# Patient Record
Sex: Male | Born: 1935 | Race: White | Hispanic: No | Smoking: Never smoker
Health system: Southern US, Community
[De-identification: ages and names within clinical notes are randomized; demographics above are authoritative.]

## PROBLEM LIST (undated history)

## (undated) DIAGNOSIS — K219 Gastro-esophageal reflux disease without esophagitis: Secondary | ICD-10-CM

## (undated) DIAGNOSIS — Z950 Presence of cardiac pacemaker: Secondary | ICD-10-CM

## (undated) DIAGNOSIS — I2510 Atherosclerotic heart disease of native coronary artery without angina pectoris: Secondary | ICD-10-CM

## (undated) DIAGNOSIS — I4891 Unspecified atrial fibrillation: Secondary | ICD-10-CM

## (undated) DIAGNOSIS — N189 Chronic kidney disease, unspecified: Secondary | ICD-10-CM

## (undated) DIAGNOSIS — Z95810 Presence of automatic (implantable) cardiac defibrillator: Secondary | ICD-10-CM

## (undated) DIAGNOSIS — I509 Heart failure, unspecified: Secondary | ICD-10-CM

## (undated) HISTORY — PX: HERNIA REPAIR: SHX51

## (undated) HISTORY — PX: CATARACT EXTRACTION: SUR2

## (undated) HISTORY — PX: CORONARY ARTERY BYPASS GRAFT: SHX141

---

## 2004-04-03 ENCOUNTER — Ambulatory Visit: Payer: Self-pay | Admitting: Rheumatology

## 2004-05-11 ENCOUNTER — Ambulatory Visit: Payer: Self-pay | Admitting: Psychiatry

## 2004-07-06 ENCOUNTER — Ambulatory Visit: Payer: Self-pay | Admitting: Psychiatry

## 2004-08-17 ENCOUNTER — Ambulatory Visit: Payer: Self-pay | Admitting: Psychiatry

## 2005-01-20 ENCOUNTER — Ambulatory Visit: Payer: Self-pay | Admitting: Family Medicine

## 2006-04-29 ENCOUNTER — Other Ambulatory Visit: Payer: Self-pay

## 2006-04-29 ENCOUNTER — Inpatient Hospital Stay: Payer: Self-pay | Admitting: Internal Medicine

## 2006-10-24 ENCOUNTER — Emergency Department: Payer: Self-pay

## 2009-07-19 ENCOUNTER — Inpatient Hospital Stay: Payer: Self-pay | Admitting: Cardiology

## 2013-04-27 ENCOUNTER — Telehealth (HOSPITAL_COMMUNITY): Payer: Self-pay

## 2013-07-16 ENCOUNTER — Ambulatory Visit: Payer: Self-pay | Admitting: Cardiology

## 2013-07-16 DIAGNOSIS — I10 Essential (primary) hypertension: Secondary | ICD-10-CM

## 2013-07-16 LAB — BASIC METABOLIC PANEL
Anion Gap: 10 (ref 7–16)
BUN: 27 mg/dL — ABNORMAL HIGH (ref 7–18)
Calcium, Total: 9.1 mg/dL (ref 8.5–10.1)
Chloride: 104 mmol/L (ref 98–107)
Co2: 24 mmol/L (ref 21–32)
Creatinine: 1.23 mg/dL (ref 0.60–1.30)
EGFR (African American): 59 — ABNORMAL LOW
GFR CALC NON AF AMER: 51 — AB
Glucose: 88 mg/dL (ref 65–99)
Osmolality: 280 (ref 275–301)
Potassium: 4.3 mmol/L (ref 3.5–5.1)
SODIUM: 138 mmol/L (ref 136–145)

## 2013-07-16 LAB — URINALYSIS, COMPLETE
Bilirubin,UR: NEGATIVE
GLUCOSE, UR: NEGATIVE mg/dL (ref 0–75)
Ketone: NEGATIVE
LEUKOCYTE ESTERASE: NEGATIVE
NITRITE: POSITIVE
PH: 6 (ref 4.5–8.0)
Protein: NEGATIVE
SPECIFIC GRAVITY: 1.013 (ref 1.003–1.030)
SQUAMOUS EPITHELIAL: NONE SEEN
WBC UR: 3 /HPF (ref 0–5)

## 2013-07-16 LAB — CBC WITH DIFFERENTIAL/PLATELET
BANDS NEUTROPHIL: 1 %
Comment - H1-Com2: NORMAL
Eosinophil: 3 %
HCT: 43.4 % (ref 40.0–52.0)
HGB: 14.6 g/dL (ref 13.0–18.0)
LYMPHS PCT: 27 %
MCH: 32 pg (ref 26.0–34.0)
MCHC: 33.6 g/dL (ref 32.0–36.0)
MCV: 95 fL (ref 80–100)
MONOS PCT: 12 %
PLATELETS: 151 10*3/uL (ref 150–440)
RBC: 4.55 10*6/uL (ref 4.40–5.90)
RDW: 13.1 % (ref 11.5–14.5)
Segmented Neutrophils: 56 %
Variant Lymphocyte - H1-Rlymph: 1 %
WBC: 10.3 10*3/uL (ref 3.8–10.6)

## 2013-07-16 LAB — PROTIME-INR
INR: 1.4
Prothrombin Time: 17.3 secs — ABNORMAL HIGH (ref 11.5–14.7)

## 2013-07-16 LAB — APTT: Activated PTT: 25.8 secs (ref 23.6–35.9)

## 2013-07-24 ENCOUNTER — Ambulatory Visit: Payer: Self-pay | Admitting: Cardiology

## 2014-07-10 NOTE — Op Note (Signed)
PATIENT NAME:  Vincent [name], Vincent [name] MR#:  [id number] DATE OF BIRTH:  [birth year]  DATE OF PROCEDURE:  07/24/2013  ATTENDING:  Caryn BeeKevin L. Maisie Fushomas, M.[name].  TITLE OF PROCEDURE NOTE: Implantation of a new biventricular implantable cardiac defibrillator generator.   INDICATION: Biventricular ICD generator at elective replacement indicator.   DETAILS OF PROCEDURE: The patient was brought to the operating room in a fasting, nonsedated state. The appropriate resuscitative equipment was attached to the patient.   The patient was prepped and draped in the usual sterile manner. The area of the right infraclavicular fossa was scrubbed with chlorhexidine. Using a scalpel, a 3 cm incision was made over the old incision site. Using a combination of electrocautery and blunt dissection, the bi-V ICD was removed from the pocket and disconnected from the wires. At this juncture, adequate hemostasis was obtained. The leads were interrogated and showed the following: P waves were 0.9 mV. The patient was in atrial fibrillation. Pacing impedance was 437. The RV lead R waves were 9.8 mV, threshold of 1.5 v at 0.4 ms and the LV lead showed a threshold of 1.5 v at 0.6 ms, and then lead impedance of 627.   The pocket was then irrigated with copious amounts of gentamicin solution. Again, hemostasis was confirmed. The new generator was attached to the leads and placed in the pocket and the pocket was closed with running layers of 2-0 and 3-0 Vicryl, and the subcuticular layer with 4-0 Vicryl. Steri-Strips were attached over the incision site, as well as a sterile gauze and OpSite.   SUMMARY OF IMPLANTED HARDWARE: The patient received a Medtronic biventricular ICD, model Viva XT CRT-[name], model number DTBA1D1, serial number [id number], implanted 07/24/2013. The right atrial lead is a Medtronic [id number], serial number [id number], implanted 08/04/2009. The RV lead is a Medtronic [id number], serial number [id number], implanted 08/04/2009. The LV lead is a  Medtronic [id number], serial number [id number], implanted 08/04/2009.   FINAL PROGRAM PARAMETERS: The device was program mode DDD with a lower rate limit of 75, upper tracking rate 120, upper sensory rate 120, adaptive CRT was programmed on, paced AV delay 130, sensed 100 with LV/RV offset of 0. Outputs were LV 2.25 at 0.6, RV 2.5 at 0.4 and atrial 3.5 at 0.4. VF detection: The device was programmed as a primary prevention device with VF detection at rates of greater than 200 beats per minute with an initial detection of 30 out of 40 intervals. VT monitor set at 167 with 32 intervals.   There were no complications noted at the conclusion of the procedure.   ESTIMATED BLOOD LOSS:   Minimal.     ____________________________ Anna GenreKevin L. Maisie Fushomas, MD klt:dmm [name]: 07/24/2013 15:54:44 ET T: 07/24/2013 21:10:45 ET JOB#: [id number]  cc: Caryn BeeKevin L. Maisie Fushomas, MD, <Dictator> Sharion SettlerKEVIN L Samir MD ELECTRONICALLY SIGNED 08/20/2013 16:50

## 2015-02-01 ENCOUNTER — Other Ambulatory Visit: Payer: Self-pay | Admitting: Internal Medicine

## 2015-02-01 ENCOUNTER — Ambulatory Visit
Admission: RE | Admit: 2015-02-01 | Discharge: 2015-02-01 | Disposition: A | Discharge status: Home / Self Care | Payer: Medicare PPO | Source: Ambulatory Visit | Attending: Internal Medicine | Admitting: Internal Medicine

## 2015-02-01 DIAGNOSIS — N261 Atrophy of kidney (terminal): Secondary | ICD-10-CM | POA: Diagnosis not present

## 2015-02-01 DIAGNOSIS — N281 Cyst of kidney, acquired: Secondary | ICD-10-CM | POA: Diagnosis not present

## 2015-02-01 DIAGNOSIS — R1084 Generalized abdominal pain: Secondary | ICD-10-CM | POA: Diagnosis present

## 2015-02-01 DIAGNOSIS — I714 Abdominal aortic aneurysm, without rupture: Secondary | ICD-10-CM | POA: Diagnosis not present

## 2015-06-02 ENCOUNTER — Observation Stay
Admission: EM | Admit: 2015-06-02 | Discharge: 2015-06-03 | Disposition: A | Discharge status: Home / Self Care | Payer: Medicare PPO | Attending: Internal Medicine | Admitting: Internal Medicine

## 2015-06-02 ENCOUNTER — Emergency Department: Payer: Medicare PPO

## 2015-06-02 DIAGNOSIS — I482 Chronic atrial fibrillation: Secondary | ICD-10-CM | POA: Diagnosis not present

## 2015-06-02 DIAGNOSIS — Z6833 Body mass index (BMI) 33.0-33.9, adult: Secondary | ICD-10-CM | POA: Diagnosis not present

## 2015-06-02 DIAGNOSIS — I429 Cardiomyopathy, unspecified: Secondary | ICD-10-CM | POA: Diagnosis not present

## 2015-06-02 DIAGNOSIS — T45515A Adverse effect of anticoagulants, initial encounter: Secondary | ICD-10-CM | POA: Insufficient documentation

## 2015-06-02 DIAGNOSIS — I4892 Unspecified atrial flutter: Secondary | ICD-10-CM | POA: Insufficient documentation

## 2015-06-02 DIAGNOSIS — E669 Obesity, unspecified: Secondary | ICD-10-CM | POA: Diagnosis not present

## 2015-06-02 DIAGNOSIS — R079 Chest pain, unspecified: Principal | ICD-10-CM | POA: Diagnosis present

## 2015-06-02 DIAGNOSIS — Z7901 Long term (current) use of anticoagulants: Secondary | ICD-10-CM | POA: Diagnosis not present

## 2015-06-02 DIAGNOSIS — M7989 Other specified soft tissue disorders: Secondary | ICD-10-CM | POA: Insufficient documentation

## 2015-06-02 DIAGNOSIS — E785 Hyperlipidemia, unspecified: Secondary | ICD-10-CM | POA: Diagnosis not present

## 2015-06-02 DIAGNOSIS — Z951 Presence of aortocoronary bypass graft: Secondary | ICD-10-CM | POA: Diagnosis not present

## 2015-06-02 DIAGNOSIS — I2510 Atherosclerotic heart disease of native coronary artery without angina pectoris: Secondary | ICD-10-CM | POA: Insufficient documentation

## 2015-06-02 DIAGNOSIS — M1990 Unspecified osteoarthritis, unspecified site: Secondary | ICD-10-CM | POA: Diagnosis not present

## 2015-06-02 DIAGNOSIS — R918 Other nonspecific abnormal finding of lung field: Secondary | ICD-10-CM | POA: Insufficient documentation

## 2015-06-02 DIAGNOSIS — G629 Polyneuropathy, unspecified: Secondary | ICD-10-CM | POA: Diagnosis not present

## 2015-06-02 DIAGNOSIS — I130 Hypertensive heart and chronic kidney disease with heart failure and stage 1 through stage 4 chronic kidney disease, or unspecified chronic kidney disease: Secondary | ICD-10-CM | POA: Diagnosis not present

## 2015-06-02 DIAGNOSIS — I5022 Chronic systolic (congestive) heart failure: Secondary | ICD-10-CM | POA: Diagnosis not present

## 2015-06-02 DIAGNOSIS — K219 Gastro-esophageal reflux disease without esophagitis: Secondary | ICD-10-CM | POA: Insufficient documentation

## 2015-06-02 DIAGNOSIS — I5189 Other ill-defined heart diseases: Secondary | ICD-10-CM | POA: Diagnosis not present

## 2015-06-02 DIAGNOSIS — M79661 Pain in right lower leg: Secondary | ICD-10-CM | POA: Insufficient documentation

## 2015-06-02 DIAGNOSIS — Z79899 Other long term (current) drug therapy: Secondary | ICD-10-CM | POA: Diagnosis not present

## 2015-06-02 DIAGNOSIS — Z95810 Presence of automatic (implantable) cardiac defibrillator: Secondary | ICD-10-CM | POA: Insufficient documentation

## 2015-06-02 DIAGNOSIS — M79671 Pain in right foot: Secondary | ICD-10-CM | POA: Diagnosis not present

## 2015-06-02 DIAGNOSIS — N189 Chronic kidney disease, unspecified: Secondary | ICD-10-CM | POA: Insufficient documentation

## 2015-06-02 HISTORY — DX: Heart failure, unspecified: I50.9

## 2015-06-02 HISTORY — DX: Presence of automatic (implantable) cardiac defibrillator: Z95.810

## 2015-06-02 HISTORY — DX: Presence of cardiac pacemaker: Z95.0

## 2015-06-02 HISTORY — DX: Atherosclerotic heart disease of native coronary artery without angina pectoris: I25.10

## 2015-06-02 NOTE — ED Notes (Signed)
Pt presents to ED with c/o sharp left sided chest pain with sob. Pt states his pain woke him up from his sleep tonight about an hour ago. Denies pain currently and states his pain only lasted a few minutes. Pt states his is no longer sob. Pt alert and answering questions without difficulty. No increased work of breathing or distress noted at this time. Skin warm and dry.

## 2015-06-03 ENCOUNTER — Emergency Department: Payer: Medicare PPO

## 2015-06-03 DIAGNOSIS — R079 Chest pain, unspecified: Secondary | ICD-10-CM | POA: Diagnosis present

## 2015-06-03 LAB — BASIC METABOLIC PANEL
ANION GAP: 5 (ref 5–15)
BUN: 37 mg/dL — ABNORMAL HIGH (ref 6–20)
CALCIUM: 8.7 mg/dL — AB (ref 8.9–10.3)
CO2: 26 mmol/L (ref 22–32)
Chloride: 107 mmol/L (ref 101–111)
Creatinine, Ser: 1.65 mg/dL — ABNORMAL HIGH (ref 0.61–1.24)
GFR calc non Af Amer: 34 mL/min — ABNORMAL LOW (ref >60)
GFR, EST AFRICAN AMERICAN: 40 mL/min — AB (ref >60)
GLUCOSE: 109 mg/dL — AB (ref 65–99)
Potassium: 3.4 mmol/L — ABNORMAL LOW (ref 3.5–5.1)
Sodium: 138 mmol/L (ref 135–145)

## 2015-06-03 LAB — TROPONIN I: Troponin I: <0.03 ng/mL (ref <0.031)

## 2015-06-03 LAB — CBC
HCT: 40.6 % (ref 40.0–52.0)
HEMATOCRIT: 40.9 % (ref 40.0–52.0)
HEMOGLOBIN: 13.6 g/dL (ref 13.0–18.0)
HEMOGLOBIN: 13.8 g/dL (ref 13.0–18.0)
MCH: 30.9 pg (ref 26.0–34.0)
MCH: 31.3 pg (ref 26.0–34.0)
MCHC: 33.3 g/dL (ref 32.0–36.0)
MCHC: 34.1 g/dL (ref 32.0–36.0)
MCV: 91.9 fL (ref 80.0–100.0)
MCV: 93 fL (ref 80.0–100.0)
Platelets: 135 10*3/uL — ABNORMAL LOW (ref 150–440)
Platelets: 136 10*3/uL — ABNORMAL LOW (ref 150–440)
RBC: 4.4 MIL/uL (ref 4.40–5.90)
RBC: 4.42 MIL/uL (ref 4.40–5.90)
RDW: 13.6 % (ref 11.5–14.5)
RDW: 13.7 % (ref 11.5–14.5)
WBC: 6.1 10*3/uL (ref 3.8–10.6)
WBC: 6.4 10*3/uL (ref 3.8–10.6)

## 2015-06-03 LAB — CREATININE, SERUM
CREATININE: 1.35 mg/dL — AB (ref 0.61–1.24)
GFR calc Af Amer: 50 mL/min — ABNORMAL LOW (ref >60)
GFR calc non Af Amer: 44 mL/min — ABNORMAL LOW (ref >60)

## 2015-06-03 LAB — PROTIME-INR
INR: 4.27
Prothrombin Time: 39.9 seconds — ABNORMAL HIGH (ref 11.4–15.0)

## 2015-06-03 MED ORDER — ISOSORBIDE MONONITRATE ER 30 MG PO TB24: 30.0000 mg | ORAL_TABLET | Freq: Every day | ORAL | Status: DC

## 2015-06-03 MED ORDER — ACETAMINOPHEN 325 MG PO TABS: 650.0000 mg | ORAL_TABLET | ORAL | Status: DC | PRN

## 2015-06-03 MED ORDER — METOPROLOL SUCCINATE ER 25 MG PO TB24
25.0000 mg | ORAL_TABLET | Freq: Every day | ORAL | Status: DC
  Administered 2015-06-03: 25 mg via ORAL
  Filled 2015-06-03: qty 1

## 2015-06-03 MED ORDER — FUROSEMIDE 40 MG PO TABS
40.0000 mg | ORAL_TABLET | Freq: Two times a day (BID) | ORAL | Status: DC
  Administered 2015-06-03: 40 mg via ORAL
  Filled 2015-06-03: qty 1

## 2015-06-03 MED ORDER — METOLAZONE 2.5 MG PO TABS
2.5000 mg | ORAL_TABLET | Freq: Every day | ORAL | Status: DC
  Administered 2015-06-03: 2.5 mg via ORAL
  Filled 2015-06-03: qty 1

## 2015-06-03 MED ORDER — ONDANSETRON HCL 4 MG/2ML IJ SOLN: 4.0000 mg | Freq: Four times a day (QID) | INTRAMUSCULAR | Status: DC | PRN

## 2015-06-03 MED ORDER — SIMVASTATIN 40 MG PO TABS
40.0000 mg | ORAL_TABLET | Freq: Every day | ORAL | Status: DC
  Filled 2015-06-03: qty 1

## 2015-06-03 MED ORDER — ENOXAPARIN SODIUM 40 MG/0.4ML ~~LOC~~ SOLN: 40.0000 mg | SUBCUTANEOUS | Status: DC

## 2015-06-03 MED ORDER — MORPHINE SULFATE (PF) 2 MG/ML IV SOLN: 2.0000 mg | INTRAVENOUS | Status: DC | PRN

## 2015-06-03 MED ORDER — POTASSIUM CHLORIDE CRYS ER 20 MEQ PO TBCR
20.0000 meq | EXTENDED_RELEASE_TABLET | Freq: Every day | ORAL | Status: DC
  Administered 2015-06-03: 20 meq via ORAL
  Filled 2015-06-03: qty 1

## 2015-06-03 NOTE — H&P (Signed)
[facility] - [place] at [facility]   PATIENT NAME: Vincent [name]    MR#:  [id number]  DATE OF BIRTH:  01/25/1920  DATE OF ADMISSION:  06/02/2015  PRIMARY CARE PHYSICIAN: Danella Penton, MD   REQUESTING/REFERRING PHYSICIAN: Dr. Manson Passey  CHIEF COMPLAINT:   Chest pain tonight HISTORY OF PRESENT ILLNESS:  Vincent [name]  is a 82 y.o. male with a known history of chronic systolic congestive heart failure, coronary artery disease status post CABG, chronic atrial fibrillation on Coumadin, diverticulosis, GERD, peripheral neuropathy comes to the emergency room with complaints of chest pain. Patient said he woke up in the middle of the night complaining of chest pain in the left mid substernal area. He felt a twinge intermittently as couple times. Denies any radiation of pain diaphoresis nausea or vomiting. Came to the emergency room found to be in paced sinus rhythm. First set of cardiac enzyme is negative. Patient also has bilateral lower extremity edema which is chronic, where he thinks his swelling has increased and has some pain in his right foot. He is being admitted for further evaluation and management.  PAST MEDICAL HISTORY:  Chronic atrial fibrillation. Next cataract next congestive heart failure systolic next diverticulosis, peripheral neuropathy, chronic renal insufficiency creatinine 1.5 a sign next fascial plantar fibromatosis.  PAST SURGICAL HISTOIRY:  Hernia repair, history of eye surgery, cataract extraction, nephrolithiasis, CABG in 2004.  SOCIAL HISTORY:   Social History  Substance Use Topics  . Smoking status: Not on file  . Smokeless tobacco: Not on file  . Alcohol Use: Not on file    FAMILY HISTORY:  Hypertension  DRUG ALLERGIES:  No Known Allergies  REVIEW OF SYSTEMS:  Review of Systems  Constitutional: Negative for fever, chills and weight loss.  HENT: Negative for ear discharge, ear pain and nosebleeds.   Eyes: Negative for blurred  vision, pain and discharge.  Respiratory: Negative for sputum production, shortness of breath, wheezing and stridor.   Cardiovascular: Positive for chest pain. Negative for palpitations, orthopnea and PND.  Gastrointestinal: Negative for nausea, vomiting, abdominal pain and diarrhea.  Genitourinary: Negative for urgency and frequency.  Musculoskeletal: Negative for back pain and joint pain.  Neurological: Negative for sensory change, speech change, focal weakness and weakness.  Psychiatric/Behavioral: Negative for depression and hallucinations. The patient is not nervous/anxious.   All other systems reviewed and are negative.    MEDICATIONS AT HOME:   Prior to Admission medications   Medication Sig Start Date End Date Taking? Authorizing Provider  furosemide (LASIX) 40 MG tablet Take 1 tablet by mouth 2 (two) times daily. 04/16/15  Yes Historical Provider, MD  KLOR-CON M20 20 MEQ tablet Take 1 tablet by mouth daily. 05/10/15  Yes Historical Provider, MD  metolazone (ZAROXOLYN) 2.5 MG tablet  03/08/15  Yes Historical Provider, MD  metoprolol succinate (TOPROL-XL) 25 MG 24 hr tablet Take 1 tablet by mouth daily. 05/13/15  Yes Historical Provider, MD  simvastatin (ZOCOR) 40 MG tablet Take 1 tablet by mouth daily. 05/10/15  Yes Historical Provider, MD  warfarin (COUMADIN) 4 MG tablet Take 1 tablet by mouth daily. 05/16/15  Yes Historical Provider, MD      VITAL SIGNS:  Blood pressure 155/86, pulse 75, temperature 97.6 F (36.4 C), temperature source Oral, resp. rate 19, height  (1.702 m), weight 97.523 kg (215 lb), SpO2 98 %.  PHYSICAL EXAMINATION:  GENERAL:  82 y.o.-year-old patient lying in the bed with no acute distress.  EYES: Pupils equal, round,  reactive to light and accommodation. No scleral icterus. Extraocular muscles intact.  HEENT: Head atraumatic, normocephalic. Oropharynx and nasopharynx clear.  NECK:  Supple, no jugular venous distention. No thyroid enlargement, no  tenderness.  LUNGS: Normal breath sounds bilaterally, no wheezing, rales,rhonchi or crepitation. No use of accessory muscles of respiration.  CARDIOVASCULAR: S1, S2 normal. No murmurs, rubs, or gallops.  ABDOMEN: Soft, nontender, nondistended. Bowel sounds present. No organomegaly or mass.  EXTREMITIES:2+l edema, cyanosis, or clubbing. Dry skin onychomycosis  NEUROLOGIC: Cranial nerves II through XII are intact. Muscle strength 5/5 in all extremities. Sensation intact. Gait not checked.  PSYCHIATRIC: The patient is alert and oriented x 3.  SKIN: No obvious rash, lesion, or ulcer.   LABORATORY PANEL:   CBC  Recent Labs Lab 06/02/15 2348  WBC 6.4  HGB 13.6  HCT 40.9  PLT 135*   ------------------------------------------------------------------------------------------------------------------  Chemistries   Recent Labs Lab 06/02/15 2348  NA 138  K 3.4*  CL 107  CO2 26  GLUCOSE 109*  BUN 37*  CREATININE 1.65*  CALCIUM 8.7*   ------------------------------------------------------------------------------------------------------------------  Cardiac Enzymes  Recent Labs Lab 06/02/15 2348  TROPONINI <0.03   ------------------------------------------------------------------------------------------------------------------  RADIOLOGY:  US Venous Img Lower Unilateral Right  06/03/2015  CLINICAL DATA:  Right calf pain and swelling. EXAM: Right LOWER EXTREMITY VENOUS DOPPLER ULTRASOUND TECHNIQUE: Gray-scale sonography with graded compression, as well as color Doppler and duplex ultrasound were performed to evaluate the lower extremity deep venous systems from the level of the common femoral vein and including the common femoral, femoral, profunda femoral, popliteal and calf veins including the posterior tibial, peroneal and gastrocnemius veins when visible. The superficial great saphenous vein was also interrogated. Spectral Doppler was utilized to evaluate flow at rest and with  distal augmentation maneuvers in the common femoral, femoral and popliteal veins. COMPARISON:  None. FINDINGS: Contralateral Common Femoral Vein: Respiratory phasicity is present and symmetric with the symptomatic side. No evidence of thrombus. Normal compressibility. Common Femoral Vein: No evidence of thrombus. Saphenofemoral Junction: No evidence of thrombus. Profunda Femoral Vein: No evidence of thrombus. Femoral Vein: No evidence of thrombus. Popliteal Vein: No evidence of thrombus. Calf Veins: No evidence of thrombus. Diffusely hyperdynamic venous waveforms. IMPRESSION: 1. Negative for right lower extremity DVT. 2. Hyperdynamic venous waveforms suggesting right heart dysfunction. Electronically Signed   By: Marnee Spring M.D.   On: 06/03/2015 02:02   Dg Chest Port 1 View  06/03/2015  CLINICAL DATA:  Chest pain EXAM: PORTABLE CHEST 1 VIEW COMPARISON:  04/29/2006 FINDINGS: Biventricular ICD/ pacer from the right has been placed. There is chronic cardiomegaly with changes of CABG. Hazy appearance at the left base with costophrenic sulcus blunting, scarring given stability. There is prominent biapical pleural thickening, symmetric and chronic. There is no edema, consolidation, effusion, or pneumothorax. IMPRESSION: 1. No acute finding. 2. Chronic pleural thickening/ scarring on the left more than right. 3. Chronic cardiomegaly. Electronically Signed   By: Marnee Spring M.D.   On: 06/03/2015 00:18    EKG:  Paced A. fib flutter rhythm  IMPRESSION AND PLAN:   Bearl Talarico  is a 82 y.o. male with a known history of chronic systolic congestive heart failure, coronary artery disease status post CABG, chronic atrial fibrillation on Coumadin, diverticulosis, GERD, peripheral neuropathy comes to the emergency room with complaints of chest pain. Patient said he woke up in the middle of the night complaining of chest pain in the left mid substernal area. He felt a twinge intermittently as couple times.  1  chest pain Patient has history of coronary artery disease status post CABG in 2004. Admit to telemetry floor Cardiology consultation with Dr. Cassie FreerParachos Cycle cardiac enzymes 3 Continue cardiac meds   2. Atrial flutter fibrillation chronic heart rate stable  On Coumadin INR is 4.27. We will have pharmacy adjust Coumadin dosing   3. Chronic systolic congestive heart failure with chronic leg edema  Continue on Lasix and metolazone along with potassium replacement  Patient does not appear to be in heart failure his sats are stable vital stable   4. DJD   5. DVT prophylaxis subcutaneous Lovenox   Above was discussed with patient and patient's family. Patient insisted/requested to go home after seen by cardiology.   All the records are reviewed and case discussed with ED provider. Management plans discussed with the patient, family and they are in agreement.  CODE STATUS: *Full  TOTAL TIME TAKING CARE OF THIS PATIENT: *50  minutes.    Kialee Kham M.D on 06/03/2015 at 3:15 AM  Between 7am to 6pm - Pager - [phone]  After 6pm go to [email or web address] - password [email or web address] [email or web address]  [facility]  Office  [phone]  CC: Primary care physician; Danella PentonMark F Miller, MD

## 2015-06-03 NOTE — Plan of Care (Signed)
Problem: Phase I Progression Outcomes Goal: Anginal pain relieved Outcome: Completed/Met Date Met:  06/03/15 Pain free on arrival to floor

## 2015-06-03 NOTE — Progress Notes (Signed)
Discharge instructions given. IV and tele removed. Patient educated on chest pain and new imdur. Patient will follow up with PCP and cardiology. No questions at this time.

## 2015-06-03 NOTE — Discharge Summary (Signed)
Vincent [name], 82 y.o., DOB [birth year], MRN [id number]. Admission date: 06/02/2015 Discharge Date 06/03/2015 Primary MD Danella Penton, MD Admitting Physician Enedina Finner, MD  Admission Diagnosis  Chest pain [R07.9] Chest pain, unspecified chest pain type [R07.9]  Discharge Diagnosis   Active Problems:  Noncardiac chest pain Atrial fibrillation Chronic systolic CHF Peripheral neuropathy Chronic renal insufficiency On a artery disease status post CABG   Hospital Course patient is a 82 year old white male with multiple medical problems presented with episode of chest pain. His evaluation in the ER was nonrevealing cardiac enzymes were negative. He was placed under observation. Patient this morning 1 at to go home. He was seen by cardiology who agreed with outpatient follow-up.          Consults  cardiology  Significant Tests:  See full reports for all details    US Venous Img Lower Unilateral Right  06/03/2015  CLINICAL DATA:  Right calf pain and swelling. EXAM: Right LOWER EXTREMITY VENOUS DOPPLER ULTRASOUND TECHNIQUE: Gray-scale sonography with graded compression, as well as color Doppler and duplex ultrasound were performed to evaluate the lower extremity deep venous systems from the level of the common femoral vein and including the common femoral, femoral, profunda femoral, popliteal and calf veins including the posterior tibial, peroneal and gastrocnemius veins when visible. The superficial great saphenous vein was also interrogated. Spectral Doppler was utilized to evaluate flow at rest and with distal augmentation maneuvers in the common femoral, femoral and popliteal veins. COMPARISON:  None. FINDINGS: Contralateral Common Femoral Vein: Respiratory phasicity is present and symmetric with the symptomatic side. No evidence of thrombus. Normal compressibility. Common Femoral Vein: No evidence of thrombus. Saphenofemoral Junction: No evidence of thrombus. Profunda Femoral Vein: No  evidence of thrombus. Femoral Vein: No evidence of thrombus. Popliteal Vein: No evidence of thrombus. Calf Veins: No evidence of thrombus. Diffusely hyperdynamic venous waveforms. IMPRESSION: 1. Negative for right lower extremity DVT. 2. Hyperdynamic venous waveforms suggesting right heart dysfunction. Electronically Signed   By: Marnee Spring M.D.   On: 06/03/2015 02:02   Dg Chest Port 1 View  06/03/2015  CLINICAL DATA:  Chest pain EXAM: PORTABLE CHEST 1 VIEW COMPARISON:  04/29/2006 FINDINGS: Biventricular ICD/ pacer from the right has been placed. There is chronic cardiomegaly with changes of CABG. Hazy appearance at the left base with costophrenic sulcus blunting, scarring given stability. There is prominent biapical pleural thickening, symmetric and chronic. There is no edema, consolidation, effusion, or pneumothorax. IMPRESSION: 1. No acute finding. 2. Chronic pleural thickening/ scarring on the left more than right. 3. Chronic cardiomegaly. Electronically Signed   By: Marnee Spring M.D.   On: 06/03/2015 00:18       Today   Subjective:   Vincent [name] 82 feels well denies any chest pains  Objective:   Blood pressure 142/61, pulse 82, temperature 97.9 F (36.6 C), temperature source Oral, resp. rate 22, height  (1.702 m), weight 97.523 kg (215 lb), SpO2 95 %.  .  Intake/Output Summary (Last 24 hours) at 06/03/15 1500 Last data filed at 06/03/15 0617  Gross per 24 hour  Intake      0 ml  Output    200 ml  Net   -200 ml    Exam VITAL SIGNS: Blood pressure 142/61, pulse 82, temperature 97.9 F (36.6 C), temperature source Oral, resp. rate 22, height  (1.702 m), weight 97.523 kg (215 lb), SpO2 95 %.  GENERAL:  82 y.o.-year-old patient lying in the bed with  no acute distress.  EYES: Pupils equal, round, reactive to light and accommodation. No scleral icterus. Extraocular muscles intact.  HEENT: Head atraumatic, normocephalic. Oropharynx and nasopharynx clear.  NECK:   Supple, no jugular venous distention. No thyroid enlargement, no tenderness.  LUNGS: Normal breath sounds bilaterally, no wheezing, rales,rhonchi or crepitation. No use of accessory muscles of respiration.  CARDIOVASCULAR: S1, S2 normal. No murmurs, rubs, or gallops.  ABDOMEN: Soft, nontender, nondistended. Bowel sounds present. No organomegaly or mass.  EXTREMITIES: No pedal edema, cyanosis, or clubbing.  NEUROLOGIC: Cranial nerves II through XII are intact. Muscle strength 5/5 in all extremities. Sensation intact. Gait not checked.  PSYCHIATRIC: The patient is alert and oriented x 3.  SKIN: No obvious rash, lesion, or ulcer.   Data Review     CBC w Diff: Lab Results  Component Value Date   WBC 6.1 06/03/2015   WBC 10.3 07/16/2013   HGB 13.8 06/03/2015   HGB 14.6 07/16/2013   HCT 40.6 06/03/2015   HCT 43.4 07/16/2013   PLT 136* 06/03/2015   PLT 151 07/16/2013   MONOPCT 12 07/16/2013   CMP: Lab Results  Component Value Date   NA 138 06/02/2015   NA 138 07/16/2013   K 3.4* 06/02/2015   K 4.3 07/16/2013   CL 107 06/02/2015   CL 104 07/16/2013   CO2 26 06/02/2015   CO2 24 07/16/2013   BUN 37* 06/02/2015   BUN 27* 07/16/2013   CREATININE 1.35* 06/03/2015   CREATININE 1.23 07/16/2013  .  Micro Results No results found for this or any previous visit (from the past 240 hour(s)).      Code Status Orders        Start     Ordered   06/03/15 0438  Full code   Continuous     06/03/15 0437    Code Status History    Date Active Date Inactive Code Status Order ID Comments User Context   This patient has a current code status but no historical code status.          Follow-up Information    Follow up with Danella PentonMark F Miller, MD In 7 days.   Specialty:  Internal Medicine   Why:  Thursday, March 23rd at 330pm, ccs   Contact information:   [address] [place] [address] [phone]       Follow up with Marcina MillardPARASCHOS,ALEXANDER, MD In 5 days.    Specialty:  Cardiology   Why:  Friday, March 24th at 1230. ccs   Contact information:   [address] [address] [facility] [place] [address] [phone]       Discharge Medications     Medication List    TAKE these medications        furosemide 40 MG tablet  Commonly known as:  LASIX  Take 1 tablet by mouth 2 (two) times daily.     isosorbide mononitrate 30 MG 24 hr tablet  Commonly known as:  IMDUR  Take 1 tablet (30 mg total) by mouth daily.     KLOR-CON M20 20 MEQ tablet  Generic drug:  potassium chloride SA  Take 1 tablet by mouth daily.     metoprolol succinate 25 MG 24 hr tablet  Commonly known as:  TOPROL-XL  Take 1 tablet by mouth daily.     simvastatin 40 MG tablet  Commonly known as:  ZOCOR  Take 1 tablet by mouth daily.     warfarin  4 MG tablet  Commonly known as:  COUMADIN  Take 1 tablet by mouth daily.           Total Time in preparing paper work, data evaluation and todays exam - 35 minutes  Auburn Bilberry M.D on 06/03/2015 at 3:00 PM  [facility]   Office  [phone]

## 2015-06-03 NOTE — Consult Note (Signed)
Reason for Consult: Chest pain congestion Referring Physician: Dr. Fritzi Mandes hospitalist., Dr. Emily Filbert primary  Vincent [name] is an 82 y.o. male.  HPI: 82 year old white male known coronary disease artery bypass surgery congestive heart failure systolic dysfunction renal insufficiency hypertension with chest pain symptoms. Patient has atrial fibrillation on Coumadin started having vague chest pains came to emergency room and supposedly was admitted for rule out. Patient has chronic lower extremity edema had mild congestion no blackout spells or syncope. Patient states he didn't feel too badly just had some mild vague chest discomfort. Patient has a history of pacemaker defibrillator because of cardiomyopathy denies any recent discharges.  Past Medical History  Diagnosis Date  . Coronary artery disease   . AICD (automatic cardioverter/defibrillator) present   . Presence of permanent cardiac pacemaker   . CHF (congestive heart failure) ([place])     History reviewed. No pertinent past surgical history.  History reviewed. No pertinent family history.  Social History:  reports that he has never smoked. He does not have any smokeless tobacco history on file. He reports that he does not drink alcohol or use illicit drugs.  Allergies: No Known Allergies  Medications: I have reviewed the patient's current medications.  Results for orders placed or performed during the hospital encounter of 06/02/15 (from the past 48 hour(s))  Basic metabolic panel     Status: Abnormal   Collection Time: 06/02/15 11:48 PM  Result Value Ref Range   Sodium 138 135 - 145 mmol/L   Potassium 3.4 (L) 3.5 - 5.1 mmol/L   Chloride 107 101 - 111 mmol/L   CO2 26 22 - 32 mmol/L   Glucose, Bld 109 (H) 65 - 99 mg/dL   BUN 37 (H) 6 - 20 mg/dL   Creatinine, Ser 1.65 (H) 0.61 - 1.24 mg/dL   Calcium 8.7 (L) 8.9 - 10.3 mg/dL   GFR calc non Af Amer 34 (L) >60 mL/min   GFR calc Af Amer 40 (L) >60 mL/min    Comment:  (NOTE) The eGFR has been calculated using the CKD EPI equation. This calculation has not been validated in all clinical situations. eGFR's persistently <60 mL/min signify possible Chronic Kidney Disease.    Anion gap 5 5 - 15  CBC     Status: Abnormal   Collection Time: 06/02/15 11:48 PM  Result Value Ref Range   WBC 6.4 3.8 - 10.6 K/uL   RBC 4.40 4.40 - 5.90 MIL/uL   Hemoglobin 13.6 13.0 - 18.0 g/dL   HCT 40.9 40.0 - 52.0 %   MCV 93.0 80.0 - 100.0 fL   MCH 30.9 26.0 - 34.0 pg   MCHC 33.3 32.0 - 36.0 g/dL   RDW 13.7 11.5 - 14.5 %   Platelets 135 (L) 150 - 440 K/uL  Troponin I     Status: None   Collection Time: 06/02/15 11:48 PM  Result Value Ref Range   Troponin I <0.03 <0.031 ng/mL    Comment:        NO INDICATION OF MYOCARDIAL INJURY.   Protime-INR     Status: Abnormal   Collection Time: 06/02/15 11:48 PM  Result Value Ref Range   Prothrombin Time 39.9 (H) 11.4 - 15.0 seconds   INR 4.27 (HH)     Comment: CRITICAL RESULT CALLED TO, READ BACK BY AND VERIFIED WITH: CALLED TO SARA OLEJAR AT 0159 ON 06/03/15 BY VAB RESULT REPEATED AND VERIFIED   Troponin I     Status: None  Collection Time: 06/03/15  4:06 AM  Result Value Ref Range   Troponin I <0.03 <0.031 ng/mL    Comment:        NO INDICATION OF MYOCARDIAL INJURY.   CBC     Status: Abnormal   Collection Time: 06/03/15  5:25 AM  Result Value Ref Range   WBC 6.1 3.8 - 10.6 K/uL   RBC 4.42 4.40 - 5.90 MIL/uL   Hemoglobin 13.8 13.0 - 18.0 g/dL   HCT 40.6 40.0 - 52.0 %   MCV 91.9 80.0 - 100.0 fL   MCH 31.3 26.0 - 34.0 pg   MCHC 34.1 32.0 - 36.0 g/dL   RDW 13.6 11.5 - 14.5 %   Platelets 136 (L) 150 - 440 K/uL  Creatinine, serum     Status: Abnormal   Collection Time: 06/03/15  5:25 AM  Result Value Ref Range   Creatinine, Ser 1.35 (H) 0.61 - 1.24 mg/dL   GFR calc non Af Amer 44 (L) >60 mL/min   GFR calc Af Amer 50 (L) >60 mL/min    Comment: (NOTE) The eGFR has been calculated using the CKD EPI equation. This  calculation has not been validated in all clinical situations. eGFR's persistently <60 mL/min signify possible Chronic Kidney Disease.     US Venous Img Lower Unilateral Right  06/03/2015  CLINICAL DATA:  Right calf pain and swelling. EXAM: Right LOWER EXTREMITY VENOUS DOPPLER ULTRASOUND TECHNIQUE: Gray-scale sonography with graded compression, as well as color Doppler and duplex ultrasound were performed to evaluate the lower extremity deep venous systems from the level of the common femoral vein and including the common femoral, femoral, profunda femoral, popliteal and calf veins including the posterior tibial, peroneal and gastrocnemius veins when visible. The superficial great saphenous vein was also interrogated. Spectral Doppler was utilized to evaluate flow at rest and with distal augmentation maneuvers in the common femoral, femoral and popliteal veins. COMPARISON:  None. FINDINGS: Contralateral Common Femoral Vein: Respiratory phasicity is present and symmetric with the symptomatic side. No evidence of thrombus. Normal compressibility. Common Femoral Vein: No evidence of thrombus. Saphenofemoral Junction: No evidence of thrombus. Profunda Femoral Vein: No evidence of thrombus. Femoral Vein: No evidence of thrombus. Popliteal Vein: No evidence of thrombus. Calf Veins: No evidence of thrombus. Diffusely hyperdynamic venous waveforms. IMPRESSION: 1. Negative for right lower extremity DVT. 2. Hyperdynamic venous waveforms suggesting right heart dysfunction. Electronically Signed   By: Monte Fantasia M.D.   On: 06/03/2015 02:02   Dg Chest Port 1 View  06/03/2015  CLINICAL DATA:  Chest pain EXAM: PORTABLE CHEST 1 VIEW COMPARISON:  04/29/2006 FINDINGS: Biventricular ICD/ pacer from the right has been placed. There is chronic cardiomegaly with changes of CABG. Hazy appearance at the left base with costophrenic sulcus blunting, scarring given stability. There is prominent biapical pleural thickening,  symmetric and chronic. There is no edema, consolidation, effusion, or pneumothorax. IMPRESSION: 1. No acute finding. 2. Chronic pleural thickening/ scarring on the left more than right. 3. Chronic cardiomegaly. Electronically Signed   By: Monte Fantasia M.D.   On: 06/03/2015 00:18    Review of Systems  Constitutional: Positive for malaise/fatigue.  HENT: Positive for congestion and hearing loss.   Eyes: Negative.   Respiratory: Positive for shortness of breath.   Cardiovascular: Positive for chest pain, orthopnea and PND.  Gastrointestinal: Negative.   Genitourinary: Negative.   Musculoskeletal: Negative.   Skin: Negative.   Neurological: Positive for weakness.  Endo/Heme/Allergies: Negative.   Psychiatric/Behavioral: Negative.  Blood pressure 142/61, pulse 82, temperature 97.9 F (36.6 C), temperature source Oral, resp. rate 22, height 5' 7"  (1.702 m), weight 97.523 kg (215 lb), SpO2 95 %. Physical Exam  Assessment/Plan: Chest pain Congestive heart failure systolic dysfunction Atrial fibrillation Coronary artery bypass  GERD Peripheral neuropathy Chronic renal insufficiency Diverticulosis Hearing loss Hyperlipidemia Mild obesity Coagulopathy secondary to Coumadin AICD permanent pacemaker . PLAN Agree with rule out for myocardial infarction Follow-up EKGs troponins Continue metoprolol for rate control and blood pressure Simvastatin for lipid management Imdur metoprolol for anginal symptoms Lasix therapy for shortness of breath and heart failure Avoid ACE inhibitor because of chronic renal insufficiency Continue Coumadin for atrial fibrillation Replace potassium because of diuretic Recommend weight loss exercise portion control If patient rules out EKG remains stable would recommend follow-up as an outpatient  Ataya Murdy D. 06/03/2015, 12:48 PM

## 2015-06-03 NOTE — Progress Notes (Signed)
ANTICOAGULATION CONSULT NOTE - Initial Consult  Pharmacy Consult for warfarin Indication: atrial fibrillation  No Known Allergies  Patient Measurements: Height: 5\' 7"  (170.2 cm) Weight: 215 lb (97.523 kg) IBW/kg (Calculated) : 66.1  Vital Signs: Temp: 97.5 F (36.4 C) (03/17 0447) Temp Source: Oral (03/17 0447) BP: 136/70 mmHg (03/17 0448) Pulse Rate: 76 (03/17 0448)  Labs:  Recent Labs  06/02/15 2348 06/03/15 0406 06/03/15 0525  HGB 13.6  --  13.8  HCT 40.9  --  40.6  PLT 135*  --  136*  LABPROT 39.9*  --   --   INR 4.27*  --   --   CREATININE 1.65*  --   --   TROPONINI <0.03 <0.03  --     Estimated Creatinine Clearance: 31.1 mL/min (by C-G formula based on Cr of 1.65).   Medical History: Past Medical History  Diagnosis Date  . Coronary artery disease   . AICD (automatic cardioverter/defibrillator) present   . Presence of permanent cardiac pacemaker   . CHF (congestive heart failure) ([facility])     Assessment: Pharmacy consulted to dose warfarin in this 82 year old male who was on a regimen of warfarin 4 mg PO daily prior to admission for atrial fibrillation.  INR on admission elevated at 4.27  Goal of Therapy:  INR 2-3 Monitor platelets by anticoagulation protocol: Yes   Plan:  HOLD warfarin tonight due to elevated INR INR ordered with AM labs tomorrow. Pharmacy will continue to follow.  Cindi CarbonMary M Miki Labuda, PharmD Clinical Pharmacist 06/03/2015,6:14 AM

## 2015-06-03 NOTE — ED Provider Notes (Signed)
[facility] Emergency Department Provider Note  ____________________________________________  Time seen: 2:00 AM  I have reviewed the triage vital signs and the nursing notes.   HISTORY  Chief Complaint Chest Pain      HPI Vincent [name] is a 82 y.o. male with history of coronary artery disease, CHF AICD presents with acute onset of sharp nonradiating left-sided chest pain 1 hour before presentation. She stated that the pain lasted a few minutes and has since resolved. Patient also admits to dyspnea the time of chest pain which is also resolved. In addition patient admits to right lower extremity pain and swelling. Patient states his current pain score is 0     Past Medical History  Diagnosis Date  . Coronary artery disease   . AICD (automatic cardioverter/defibrillator) present   . Presence of permanent cardiac pacemaker   . CHF (congestive heart failure) [facility])     Patient Active Problem List   Diagnosis Date Noted  . Chest pain 06/03/2015   Past surgical history None No current outpatient prescriptions on file.  Allergies No known drug allergies History reviewed. No pertinent family history.  Social History Social History  Substance Use Topics  . Smoking status: Never Smoker   . Smokeless tobacco: None  . Alcohol Use: No    Review of Systems  Constitutional: Negative for fever. Eyes: Negative for visual changes. ENT: Negative for sore throat. Cardiovascular: Positive for chest pain. Respiratory: Positive for shortness of breath. Gastrointestinal: Negative for abdominal pain, vomiting and diarrhea. Genitourinary: Negative for dysuria. Musculoskeletal: Negative for back pain. Skin: Negative for rash. Neurological: Negative for headaches, focal weakness or numbness.   10-point ROS otherwise negative.  ____________________________________________   PHYSICAL EXAM:  VITAL SIGNS: ED Triage Vitals  Enc Vitals Group     BP  06/02/15 2344 147/90 mmHg     Pulse Rate 06/02/15 2344 75     Resp 06/02/15 2344 20     Temp 06/02/15 2344 97.6 F (36.4 C)     Temp Source 06/02/15 2344 Oral     SpO2 06/02/15 2344 98 %     Weight 06/02/15 2344 215 lb (97.523 kg)     Height 06/02/15 2344  (1.702 m)     Head Cir --      Peak Flow --      Pain Score --      Pain Loc --      Pain Edu? --      Excl. in GC? --      Constitutional: Alert and oriented. Well appearing and in no distress. Eyes: Conjunctivae are normal. PERRL. Normal extraocular movements. ENT   Head: Normocephalic and atraumatic.   Nose: No congestion/rhinnorhea.   Mouth/Throat: Mucous membranes are moist.   Neck: No stridor. Hematological/Lymphatic/Immunilogical: No cervical lymphadenopathy. Cardiovascular: Normal rate, regular rhythm. Normal and symmetric distal pulses are present in all extremities. No murmurs, rubs, or gallops. Respiratory: Normal respiratory effort without tachypnea nor retractions. Breath sounds are clear and equal bilaterally. No wheezes/rales/rhonchi. Gastrointestinal: Soft and nontender. No distention. There is no CVA tenderness. Genitourinary: deferred Musculoskeletal: Nontender with normal range of motion in all extremities. No joint effusions.  No lower extremity tenderness nor edema. Neurologic:  Normal speech and language. No gross focal neurologic deficits are appreciated. Speech is normal.  Skin:  Skin is warm, dry and intact. No rash noted. Psychiatric: Mood and affect are normal. Speech and behavior are normal. Patient exhibits appropriate insight and judgment.  ____________________________________________  LABS (pertinent positives/negatives)  Labs Reviewed  BASIC METABOLIC PANEL - Abnormal; Notable for the following:    Potassium 3.4 (*)    Glucose, Bld 109 (*)    BUN 37 (*)    Creatinine, Ser 1.65 (*)    Calcium 8.7 (*)    GFR calc non Af Amer 34 (*)    GFR calc Af Amer 40 (*)    All  other components within normal limits  CBC - Abnormal; Notable for the following:    Platelets 135 (*)    All other components within normal limits  PROTIME-INR - Abnormal; Notable for the following:    Prothrombin Time 39.9 (*)    INR 4.27 (*)    All other components within normal limits  CBC - Abnormal; Notable for the following:    Platelets 136 (*)    All other components within normal limits  CREATININE, SERUM - Abnormal; Notable for the following:    Creatinine, Ser 1.35 (*)    GFR calc non Af Amer 44 (*)    GFR calc Af Amer 50 (*)    All other components within normal limits  TROPONIN I  TROPONIN I  TROPONIN I  TROPONIN I     ____________________________________________   EKG  ED ECG REPORT I, [place] N BROWN, the attending physician, personally viewed and interpreted this ECG.   Date: 06/03/2015  EKG Time: 11:35 PM  Rate: 77  Rhythm: AV paced rhythm  Axis: None  Intervals: Normal  ST&T Change: None   ____________________________________________    RADIOLOGY US Venous Img Lower Unilateral Right (Final result) Result time: 06/03/15 02:02:22   Final result by Rad Results In Interface (06/03/15 02:02:22)   Narrative:   CLINICAL DATA: Right calf pain and swelling.  EXAM: Right LOWER EXTREMITY VENOUS DOPPLER ULTRASOUND  TECHNIQUE: Gray-scale sonography with graded compression, as well as color Doppler and duplex ultrasound were performed to evaluate the lower extremity deep venous systems from the level of the common femoral vein and including the common femoral, femoral, profunda femoral, popliteal and calf veins including the posterior tibial, peroneal and gastrocnemius veins when visible. The superficial great saphenous vein was also interrogated. Spectral Doppler was utilized to evaluate flow at rest and with distal augmentation maneuvers in the common femoral, femoral and popliteal veins.  COMPARISON: None.  FINDINGS: Contralateral Common  Femoral Vein: Respiratory phasicity is present and symmetric with the symptomatic side. No evidence of thrombus. Normal compressibility.  Common Femoral Vein: No evidence of thrombus.  Saphenofemoral Junction: No evidence of thrombus.  Profunda Femoral Vein: No evidence of thrombus.  Femoral Vein: No evidence of thrombus.  Popliteal Vein: No evidence of thrombus.  Calf Veins: No evidence of thrombus.  Diffusely hyperdynamic venous waveforms.  IMPRESSION: 1. Negative for right lower extremity DVT. 2. Hyperdynamic venous waveforms suggesting right heart dysfunction.   Electronically Signed By: Marnee Spring M.D. On: 06/03/2015 02:02          DG Chest Port 1 View (Final result) Result time: 06/03/15 00:18:32   Final result by Rad Results In Interface (06/03/15 00:18:32)   Narrative:   CLINICAL DATA: Chest pain  EXAM: PORTABLE CHEST 1 VIEW  COMPARISON: 04/29/2006  FINDINGS: Biventricular ICD/ pacer from the right has been placed. There is chronic cardiomegaly with changes of CABG. Hazy appearance at the left base with costophrenic sulcus blunting, scarring given stability. There is prominent biapical pleural thickening, symmetric and chronic. There is no edema, consolidation, effusion, or pneumothorax.  IMPRESSION: 1.  No acute finding. 2. Chronic pleural thickening/ scarring on the left more than right. 3. Chronic cardiomegaly.   Electronically Signed By: Marnee SpringJonathon Watts M.D. On: 06/03/2015 00:18        ECG Results        INITIAL IMPRESSION / ASSESSMENT AND PLAN / ED COURSE  Pertinent labs & imaging results that were available during my care of the patient were reviewed by me and considered in my medical decision making (see chart for details).  History of physical exam concerning for possible cardiac etiology of chest pain as such patient admitted to Dr. Allena KatzPatel for further evaluation and  management  ____________________________________________   FINAL CLINICAL IMPRESSION(S) / ED DIAGNOSES  Final diagnoses:  Chest pain, unspecified chest pain type      Darci Currentandolph N Brown, MD 06/03/15 [phone]

## 2015-06-03 NOTE — Care Management Obs Status (Signed)
MEDICARE OBSERVATION STATUS NOTIFICATION   Patient Details  Name: Vincent [name] MRN: [id number] Date of Birth: [birth year]   Medicare Observation Status Notification Given:  Yes  Notice given 11:45 . Late documenting    Eber HongGreene, Kamaljit Hizer R, RN 06/03/2015, 5:48 PM

## 2015-06-03 NOTE — Discharge Instructions (Signed)

## 2015-12-10 ENCOUNTER — Inpatient Hospital Stay
Admission: EM | Admit: 2015-12-10 | Discharge: 2015-12-13 | DRG: 872 | Disposition: A | Discharge status: Skilled Nursing Facility | Payer: Medicare PPO | Attending: Internal Medicine | Admitting: Internal Medicine

## 2015-12-10 ENCOUNTER — Emergency Department: Payer: Medicare PPO

## 2015-12-10 ENCOUNTER — Encounter: Payer: Self-pay | Admitting: Emergency Medicine

## 2015-12-10 DIAGNOSIS — Z888 Allergy status to other drugs, medicaments and biological substances status: Secondary | ICD-10-CM | POA: Diagnosis not present

## 2015-12-10 DIAGNOSIS — I5022 Chronic systolic (congestive) heart failure: Secondary | ICD-10-CM | POA: Diagnosis present

## 2015-12-10 DIAGNOSIS — I482 Chronic atrial fibrillation: Secondary | ICD-10-CM | POA: Diagnosis present

## 2015-12-10 DIAGNOSIS — E785 Hyperlipidemia, unspecified: Secondary | ICD-10-CM | POA: Diagnosis present

## 2015-12-10 DIAGNOSIS — Z23 Encounter for immunization: Secondary | ICD-10-CM | POA: Diagnosis not present

## 2015-12-10 DIAGNOSIS — L03115 Cellulitis of right lower limb: Secondary | ICD-10-CM | POA: Diagnosis present

## 2015-12-10 DIAGNOSIS — Z95810 Presence of automatic (implantable) cardiac defibrillator: Secondary | ICD-10-CM | POA: Diagnosis not present

## 2015-12-10 DIAGNOSIS — Z7982 Long term (current) use of aspirin: Secondary | ICD-10-CM | POA: Diagnosis not present

## 2015-12-10 DIAGNOSIS — R2689 Other abnormalities of gait and mobility: Secondary | ICD-10-CM

## 2015-12-10 DIAGNOSIS — N390 Urinary tract infection, site not specified: Secondary | ICD-10-CM | POA: Diagnosis present

## 2015-12-10 DIAGNOSIS — Z7901 Long term (current) use of anticoagulants: Secondary | ICD-10-CM

## 2015-12-10 DIAGNOSIS — I2510 Atherosclerotic heart disease of native coronary artery without angina pectoris: Secondary | ICD-10-CM | POA: Diagnosis present

## 2015-12-10 DIAGNOSIS — I878 Other specified disorders of veins: Secondary | ICD-10-CM | POA: Diagnosis present

## 2015-12-10 DIAGNOSIS — R000 Tachycardia, unspecified: Secondary | ICD-10-CM | POA: Diagnosis present

## 2015-12-10 DIAGNOSIS — Z79899 Other long term (current) drug therapy: Secondary | ICD-10-CM | POA: Diagnosis not present

## 2015-12-10 DIAGNOSIS — N183 Chronic kidney disease, stage 3 (moderate): Secondary | ICD-10-CM | POA: Diagnosis present

## 2015-12-10 DIAGNOSIS — Z886 Allergy status to analgesic agent status: Secondary | ICD-10-CM | POA: Diagnosis not present

## 2015-12-10 DIAGNOSIS — L0390 Cellulitis, unspecified: Secondary | ICD-10-CM | POA: Diagnosis present

## 2015-12-10 DIAGNOSIS — A419 Sepsis, unspecified organism: Principal | ICD-10-CM | POA: Diagnosis present

## 2015-12-10 DIAGNOSIS — R262 Difficulty in walking, not elsewhere classified: Secondary | ICD-10-CM

## 2015-12-10 DIAGNOSIS — M7989 Other specified soft tissue disorders: Secondary | ICD-10-CM

## 2015-12-10 DIAGNOSIS — I130 Hypertensive heart and chronic kidney disease with heart failure and stage 1 through stage 4 chronic kidney disease, or unspecified chronic kidney disease: Secondary | ICD-10-CM | POA: Diagnosis present

## 2015-12-10 HISTORY — DX: Unspecified atrial fibrillation: I48.91

## 2015-12-10 LAB — CBC WITH DIFFERENTIAL/PLATELET
BASOS ABS: 0.1 10*3/uL (ref 0–0.1)
Basophils Relative: 1 %
Eosinophils Absolute: 0 10*3/uL (ref 0–0.7)
Eosinophils Relative: 0 %
HEMATOCRIT: 42.5 % (ref 40.0–52.0)
HEMOGLOBIN: 14.2 g/dL (ref 13.0–18.0)
LYMPHS ABS: 0.3 10*3/uL — AB (ref 1.0–3.6)
LYMPHS PCT: 2 %
MCH: 31.1 pg (ref 26.0–34.0)
MCHC: 33.4 g/dL (ref 32.0–36.0)
MCV: 93.2 fL (ref 80.0–100.0)
Monocytes Absolute: 0.8 10*3/uL (ref 0.2–1.0)
Monocytes Relative: 4 %
NEUTROS ABS: 16.7 10*3/uL — AB (ref 1.4–6.5)
Neutrophils Relative %: 93 %
PLATELETS: 115 10*3/uL — AB (ref 150–440)
RBC: 4.56 MIL/uL (ref 4.40–5.90)
RDW: 13.7 % (ref 11.5–14.5)
WBC: 17.8 10*3/uL — AB (ref 3.8–10.6)

## 2015-12-10 LAB — COMPREHENSIVE METABOLIC PANEL
ALK PHOS: 132 U/L — AB (ref 38–126)
ALT: 20 U/L (ref 17–63)
AST: 30 U/L (ref 15–41)
Albumin: 4.1 g/dL (ref 3.5–5.0)
Anion gap: 6 (ref 5–15)
BILIRUBIN TOTAL: 1.9 mg/dL — AB (ref 0.3–1.2)
BUN: 29 mg/dL — AB (ref 6–20)
CALCIUM: 9 mg/dL (ref 8.9–10.3)
CHLORIDE: 102 mmol/L (ref 101–111)
CO2: 28 mmol/L (ref 22–32)
CREATININE: 1.5 mg/dL — AB (ref 0.61–1.24)
GFR, EST AFRICAN AMERICAN: 44 mL/min — AB (ref >60)
GFR, EST NON AFRICAN AMERICAN: 38 mL/min — AB (ref >60)
Glucose, Bld: 173 mg/dL — ABNORMAL HIGH (ref 65–99)
Potassium: 3.7 mmol/L (ref 3.5–5.1)
Sodium: 136 mmol/L (ref 135–145)
Total Protein: 7.5 g/dL (ref 6.5–8.1)

## 2015-12-10 LAB — LACTIC ACID, PLASMA: LACTIC ACID, VENOUS: 2.1 mmol/L — AB (ref 0.5–1.9)

## 2015-12-10 LAB — URINALYSIS COMPLETE WITH MICROSCOPIC (ARMC ONLY)
Bacteria, UA: NONE SEEN
Bilirubin Urine: NEGATIVE
GLUCOSE, UA: NEGATIVE mg/dL
Ketones, ur: NEGATIVE mg/dL
Nitrite: POSITIVE — AB
PROTEIN: 30 mg/dL — AB
Specific Gravity, Urine: 1.011 (ref 1.005–1.030)
pH: 6 (ref 5.0–8.0)

## 2015-12-10 LAB — PROTIME-INR
INR: 2.43
Prothrombin Time: 26.9 seconds — ABNORMAL HIGH (ref 11.4–15.2)

## 2015-12-10 LAB — TROPONIN I: TROPONIN I: 0.03 ng/mL — AB (ref <0.03)

## 2015-12-10 MED ORDER — ONDANSETRON HCL 4 MG PO TABS: 4.0000 mg | ORAL_TABLET | Freq: Four times a day (QID) | ORAL | Status: DC | PRN

## 2015-12-10 MED ORDER — INFLUENZA VAC SPLIT QUAD 0.5 ML IM SUSY
0.5000 mL | PREFILLED_SYRINGE | INTRAMUSCULAR | Status: AC
  Administered 2015-12-11: 0.5 mL via INTRAMUSCULAR
  Filled 2015-12-10: qty 0.5

## 2015-12-10 MED ORDER — POTASSIUM CHLORIDE CRYS ER 20 MEQ PO TBCR
20.0000 meq | EXTENDED_RELEASE_TABLET | Freq: Every day | ORAL | Status: DC
  Administered 2015-12-11 – 2015-12-13 (×3): 20 meq via ORAL
  Filled 2015-12-10 (×3): qty 1

## 2015-12-10 MED ORDER — SIMVASTATIN 40 MG PO TABS
40.0000 mg | ORAL_TABLET | Freq: Every day | ORAL | Status: DC
Start: 2015-12-10 — End: 2015-12-13
  Administered 2015-12-11 – 2015-12-13 (×3): 40 mg via ORAL
  Filled 2015-12-10 (×3): qty 1

## 2015-12-10 MED ORDER — ISOSORBIDE MONONITRATE ER 30 MG PO TB24
30.0000 mg | ORAL_TABLET | Freq: Every day | ORAL | Status: DC
  Administered 2015-12-11 – 2015-12-13 (×3): 30 mg via ORAL
  Filled 2015-12-10 (×3): qty 1

## 2015-12-10 MED ORDER — SODIUM CHLORIDE 0.9 % IV BOLUS (SEPSIS)
1000.0000 mL | Freq: Once | INTRAVENOUS | Status: AC
Start: 2015-12-10 — End: 2015-12-10
  Administered 2015-12-10: 1000 mL via INTRAVENOUS

## 2015-12-10 MED ORDER — PNEUMOCOCCAL VAC POLYVALENT 25 MCG/0.5ML IJ INJ
0.5000 mL | INJECTION | INTRAMUSCULAR | Status: AC
  Administered 2015-12-12: 14:00:00 0.5 mL via INTRAMUSCULAR
  Filled 2015-12-10: qty 0.5

## 2015-12-10 MED ORDER — ONDANSETRON HCL 4 MG/2ML IJ SOLN
4.0000 mg | Freq: Four times a day (QID) | INTRAMUSCULAR | Status: DC | PRN
  Administered 2015-12-10 – 2015-12-12 (×2): 4 mg via INTRAVENOUS
  Filled 2015-12-10 (×2): qty 2

## 2015-12-10 MED ORDER — ACETAMINOPHEN 650 MG RE SUPP: 650.0000 mg | Freq: Four times a day (QID) | RECTAL | Status: DC | PRN

## 2015-12-10 MED ORDER — FUROSEMIDE 40 MG PO TABS
40.0000 mg | ORAL_TABLET | Freq: Two times a day (BID) | ORAL | Status: DC
  Administered 2015-12-10 – 2015-12-13 (×6): 40 mg via ORAL
  Filled 2015-12-10 (×7): qty 1

## 2015-12-10 MED ORDER — WARFARIN - PHYSICIAN DOSING INPATIENT
Freq: Every day | Status: DC
  Administered 2015-12-11 – 2015-12-12 (×2)

## 2015-12-10 MED ORDER — METOPROLOL SUCCINATE ER 25 MG PO TB24
25.0000 mg | ORAL_TABLET | Freq: Every day | ORAL | Status: DC
  Administered 2015-12-11 – 2015-12-13 (×3): 25 mg via ORAL
  Filled 2015-12-10 (×3): qty 1

## 2015-12-10 MED ORDER — ACETAMINOPHEN 325 MG PO TABS
650.0000 mg | ORAL_TABLET | Freq: Four times a day (QID) | ORAL | Status: DC | PRN
  Administered 2015-12-12: 650 mg via ORAL

## 2015-12-10 MED ORDER — VANCOMYCIN HCL IN DEXTROSE 1-5 GM/200ML-% IV SOLN
1000.0000 mg | Freq: Once | INTRAVENOUS | Status: AC
  Administered 2015-12-10: 1000 mg via INTRAVENOUS
  Filled 2015-12-10: qty 200

## 2015-12-10 MED ORDER — WARFARIN SODIUM 4 MG PO TABS
4.0000 mg | ORAL_TABLET | Freq: Every day | ORAL | Status: DC
  Administered 2015-12-10 – 2015-12-12 (×3): 4 mg via ORAL
  Filled 2015-12-10 (×3): qty 1

## 2015-12-10 MED ORDER — VANCOMYCIN HCL 10 G IV SOLR
1250.0000 mg | INTRAVENOUS | Status: DC
  Administered 2015-12-11 – 2015-12-13 (×3): 1250 mg via INTRAVENOUS
  Filled 2015-12-10 (×3): qty 1250

## 2015-12-10 MED ORDER — CEFTRIAXONE SODIUM 1 G IJ SOLR
1.0000 g | Freq: Once | INTRAMUSCULAR | Status: AC
  Administered 2015-12-10: 1 g via INTRAVENOUS
  Filled 2015-12-10: qty 10

## 2015-12-10 NOTE — ED Triage Notes (Signed)
Pt arrived from home by EMS with complaints of malaise and vomiting x1 upon waking. Pt states he just doesn't feel good. Family states that is very unusual for the pt to admit to not feeling well and that he has been sleeping a lot which is also unusual.

## 2015-12-10 NOTE — H&P (Signed)
[employer] [facility] [place] [facility]   PATIENT NAME: Vincent [name] Grisso    MR#:  [id number]  DATE OF BIRTH:  [birth year]  DATE OF ADMISSION:  12/10/2015  PRIMARY CARE PHYSICIAN: Danella PentonMark F Miller, MD   REQUESTING/REFERRING PHYSICIAN: Dr. Nita Sicklearolina Veronese  CHIEF COMPLAINT:   Chief Complaint  Patient presents with  . Fatigue  . Urinary Tract Infection    HISTORY OF PRESENT ILLNESS:  Vincent [name] Moorman  is a 82 y.o. male with a known history of Paroxysmal atrial fibrillation, status post AICD, history of CHF, history of coronary disease, essential hypertension who presented to the hospital due to nausea vomiting and right lower extremity pain. Patient says that he has not been feeling well this morning and his granddaughters came to visit him and he was nauseous and vomiting and therefore was brought to the ER for further evaluation. The daughters also noticed that his right foot was more swollen and red than usual. He was also having some significant chills and therefore brought to the ER for further evaluation. Emergency room patient was noted to be tachycardic, with a leukocytosis and also noted to have a right lower extreme cellulitis. Hospitalist service were contacted for further treatment and evaluation. Patient denies any chest pain, shortness of breath, abdominal pain, diarrhea, any sick contacts or any other associated symptoms presently.  PAST MEDICAL HISTORY:   Past Medical History:  Diagnosis Date  . AICD (automatic cardioverter/defibrillator) present   . Atrial fibrillation (HCC)   . CHF (congestive heart failure) (HCC)   . Coronary artery disease   . Presence of permanent cardiac pacemaker     PAST SURGICAL HISTORY:  History reviewed. No pertinent surgical history.  SOCIAL HISTORY:   Social History  Substance Use Topics  . Smoking status: Never Smoker  . Smokeless tobacco: Never Used  . Alcohol use No    FAMILY HISTORY:   Family History  Problem  Relation Age of Onset  . Heart disease Neg Hx   . Diabetes Neg Hx     DRUG ALLERGIES:   Allergies  Allergen Reactions  . Atorvastatin     Other reaction(s): Unknown  . Codeine Nausea Only  . Meperidine Nausea Only    REVIEW OF SYSTEMS:   Review of Systems  Constitutional: Negative for fever and weight loss.  HENT: Negative for congestion, nosebleeds and tinnitus.   Eyes: Negative for blurred vision, double vision and redness.  Respiratory: Negative for cough, hemoptysis and shortness of breath.   Cardiovascular: Negative for chest pain, orthopnea, leg swelling and PND.  Gastrointestinal: Positive for nausea and vomiting. Negative for abdominal pain, diarrhea and melena.  Genitourinary: Negative for dysuria, hematuria and urgency.  Musculoskeletal: Negative for falls and joint pain.  Neurological: Negative for dizziness, tingling, sensory change, focal weakness, seizures, weakness and headaches.  Endo/Heme/Allergies: Negative for polydipsia. Does not bruise/bleed easily.  Psychiatric/Behavioral: Negative for depression and memory loss. The patient is not nervous/anxious.     MEDICATIONS AT HOME:   Prior to Admission medications   Medication Sig Start Date End Date Taking? Authorizing Provider  aspirin EC 81 MG tablet Take 81 mg by mouth daily.   Yes Historical Provider, MD  furosemide (LASIX) 40 MG tablet Take 1 tablet by mouth 2 (two) times daily. 04/16/15  Yes Historical Provider, MD  galantamine (RAZADYNE) 4 MG tablet Take 1 tablet by mouth every morning.   Yes Historical Provider, MD  KLOR-CON M20 20 MEQ tablet Take 1 tablet by mouth daily. 05/10/15  Yes Historical Provider, MD  metoprolol succinate (TOPROL-XL) 25 MG 24 hr tablet Take 1 tablet by mouth daily. 05/13/15  Yes Historical Provider, MD  simvastatin (ZOCOR) 40 MG tablet Take 1 tablet by mouth daily at 6 PM.  05/10/15  Yes Historical Provider, MD  warfarin (COUMADIN) 4 MG tablet Take 1 tablet by mouth daily at 6 PM.   05/16/15  Yes Historical Provider, MD  isosorbide mononitrate (IMDUR) 30 MG 24 hr tablet Take 1 tablet (30 mg total) by mouth daily. Patient not taking: Reported on 12/10/2015 06/03/15   Auburn Bilberry, MD      VITAL SIGNS:  Blood pressure 114/67, pulse 76, temperature 99.6 F (37.6 C), temperature source Oral, resp. rate (!) 26, height 5\' 7"  (1.702 m), weight 95.3 kg (210 lb), SpO2 96 %.  PHYSICAL EXAMINATION:  Physical Exam  GENERAL:  82 y.o.-year-old patient lying in the bed in acute distress.  EYES: Pupils equal, round, reactive to light and accommodation. No scleral icterus. Extraocular muscles intact.  HEENT: Head atraumatic, normocephalic. Oropharynx and nasopharynx clear. No oropharyngeal erythema, moist oral mucosa  NECK:  Supple, no jugular venous distention. No thyroid enlargement, no tenderness.  LUNGS: Normal breath sounds bilaterally, no wheezing, rales, rhonchi. No use of accessory muscles of respiration.  CARDIOVASCULAR: S1, S2 RRR. No murmurs, rubs, gallops, clicks.  ABDOMEN: Soft, nontender, nondistended. Bowel sounds present. No organomegaly or mass.  EXTREMITIES: +2 edema b/l, No cyanosis, or clubbing. + 2 pedal & radial pulses b/l.   NEUROLOGIC: Cranial nerves II through XII are intact. No focal Motor or sensory deficits appreciated b/l PSYCHIATRIC: The patient is alert and oriented x 3. Good affect.  SKIN: No obvious rash, lesion, or ulcer. Multiple seborrheic keratoses throughout body. Right foot swelling and redness consistent with cellulitis.  LABORATORY PANEL:   CBC  Recent Labs Lab 12/10/15 1501  WBC 17.8*  HGB 14.2  HCT 42.5  PLT 115*   ------------------------------------------------------------------------------------------------------------------  Chemistries   Recent Labs Lab 12/10/15 1501  NA 136  K 3.7  CL 102  CO2 28  GLUCOSE 173*  BUN 29*  CREATININE 1.50*  CALCIUM 9.0  AST 30  ALT 20  ALKPHOS 132*  BILITOT 1.9*    ------------------------------------------------------------------------------------------------------------------  Cardiac Enzymes  Recent Labs Lab 12/10/15 1501  TROPONINI 0.03*   ------------------------------------------------------------------------------------------------------------------  RADIOLOGY:  Dg Chest 2 View  Result Date: 12/10/2015 CLINICAL DATA:  Lower extremity swelling EXAM: CHEST  2 VIEW COMPARISON:  06/02/2015 chest radiograph. FINDINGS: Stable configuration of sternotomy wires and 3 lead right subclavian ICD. Stable cardiomediastinal silhouette with mild cardiomegaly and aortic atherosclerosis. No pneumothorax. Trace bilateral pleural effusions. Mild pulmonary edema. No acute consolidative airspace disease. IMPRESSION: 1. Mild congestive heart failure. 2. Trace bilateral pleural effusions. 3. No acute consolidative airspace disease. 4. Aortic atherosclerosis. Electronically Signed   By: Delbert Phenix M.D.   On: 12/10/2015 16:33   US Venous Img Lower Unilateral Right  Result Date: 12/10/2015 CLINICAL DATA:  Right lower extremity edema EXAM: RIGHT LOWER EXTREMITY VENOUS DUPLEX ULTRASOUND TECHNIQUE: Gray-scale sonography with graded compression, as well as color Doppler and duplex ultrasound were performed to evaluate the right lower extremity deep venous system from the level of the common femoral vein and including the common femoral, femoral, profunda femoral, popliteal and calf veins including the posterior tibial, peroneal and gastrocnemius veins when visible. The superficial great saphenous vein was also interrogated. Spectral Doppler was utilized to evaluate flow at rest and with distal augmentation maneuvers in the common femoral,  femoral and popliteal veins. COMPARISON:  June 03, 2015 FINDINGS: Contralateral Common Femoral Vein: Respiratory phasicity is normal and symmetric with the symptomatic side. No evidence of thrombus. Normal compressibility. Common Femoral  Vein: No evidence of thrombus. Normal compressibility, respiratory phasicity and response to augmentation. Saphenofemoral Junction: No evidence of thrombus. Normal compressibility and flow on color Doppler imaging. Profunda Femoral Vein: No evidence of thrombus. Normal compressibility and flow on color Doppler imaging. Femoral Vein: No evidence of thrombus. Normal compressibility, respiratory phasicity and response to augmentation. Popliteal Vein: No evidence of thrombus. Normal compressibility, respiratory phasicity and response to augmentation. Calf Veins: No evidence of thrombus in the posterior tibial vein. Right peroneal vein not well seen. Normal compressibility and flow on color Doppler imaging in visualized calf venous structures. Superficial Great Saphenous Vein: No evidence of thrombus. Normal compressibility and flow on color Doppler imaging. Venous Reflux:  None. Other Findings:  Soft tissue edema right lower extremity IMPRESSION: No evidence of right lower extremity deep venous thrombosis. Note that the right peroneal vein is not well seen due to lower extremity edema. Left common femoral vein also patent. Electronically Signed   By: Bretta Bang III M.D.   On: 12/10/2015 17:09     IMPRESSION AND PLAN:   82 year old male with past medical history of paroxysmal atrial fibrillation, coronary artery disease, hypertension, status post AICD who presents to the hospital due to nausea vomiting and right lower extremity redness and pain and noted to have an acute cellulitis.   1. Sepsis-patient's meets criteria given his leukocytosis, tachycardia and right lower extremity redness and swelling consistent with cellulitis. -I will place the patient and IV vancomycin, follow blood cultures follow clinically. -Presently hemodynamically stable.  2. Right lower extreme cellulitis-this is the cause of patient's sepsis. -Place patient on IV vancomycin, follow blood cultures and follow  clinically. -Patient has significant bilateral lower extremity edema and chronic venous stasis. He would benefit from Unna boots bilaterally once his acute infection has resolved.  3. Leukocytosis-secondary to cellulitis. Follow with IV antibiotic therapy.  4. History of CHF-clinically patient is not in congestive heart failure. Continue Lasix, Toprol.  5. History of chronic atrial fibrillation-patient is status post AICD. -Currently rate controlled. Continue Toprol. Continue Coumadin, INR is therapeutic.  6. Essential hypertension-continue Imdur, metoprolol.  7. Hyperlipidemia-continue simvastatin.    All the records are reviewed and case discussed with ED provider. Management plans discussed with the patient, family and they are in agreement.  CODE STATUS: Full  TOTAL TIME TAKING CARE OF THIS PATIENT: 45 minutes.    Houston Siren M.D on 12/10/2015 at 6:33 PM  Between 7am to 6pm - Pager - [phone]  After 6pm go to [email or web address] - password [email or web address] [facility]  [facility]  Office  [phone]  CC: Primary care physician; Danella Penton, MD

## 2015-12-10 NOTE — ED Provider Notes (Signed)
[facility] Emergency Department Provider Note  ____________________________________________  Time seen: Approximately 2:53 PM  I have reviewed the triage vital signs and the nursing notes.   HISTORY  Chief Complaint Fatigue and Urinary Tract Infection   HPI Vincent [name] is a 82 y.o. male history of CAD status post AICD, CHF (EF 45%), CKD, HTN, HLD presents for evaluation of generalized fatigue and weakness. Patient reports that he was well yesterday evening and this morning when he woke up he had generalized malaise and weakness. Had one episode of nonbloody nonbilious emesis. Unclear who called the ambulance this patient lives alone and reports that his family is currently at the beach. Patient also reports diffuse body aches. He denies fever, diarrhea, pain or burning with urination, abdominal pain, sore throat, coughing, shortness of breath, chest pain.   Past Medical History:  Diagnosis Date  . AICD (automatic cardioverter/defibrillator) present   . CHF (congestive heart failure) (HCC)   . Coronary artery disease   . Presence of permanent cardiac pacemaker     Patient Active Problem List   Diagnosis Date Noted  . Chest pain 06/03/2015    History reviewed. No pertinent surgical history.  Prior to Admission medications   Medication Sig Start Date End Date Taking? Authorizing Provider  furosemide (LASIX) 40 MG tablet Take 1 tablet by mouth 2 (two) times daily. 04/16/15   Historical Provider, MD  isosorbide mononitrate (IMDUR) 30 MG 24 hr tablet Take 1 tablet (30 mg total) by mouth daily. 06/03/15   Shreyang Patel, MD  KLOR-CON M20 20 MEQ tablet Take 1 tablet by mouth daily. 05/10/15   Historical Provider, MD  metoprolol succinate (TOPROL-XL) 25 MG 24 hr tablet Take 1 tablet by mouth daily. 05/13/15   Historical Provider, MD  simvastatin (ZOCOR) 40 MG tablet Take 1 tablet by mouth daily. 05/10/15   Historical Provider, MD  warfarin (COUMADIN) 4 MG  tablet Take 1 tablet by mouth daily. 05/16/15   Historical Provider, MD    Allergies Review of patient's allergies indicates no known allergies.  History reviewed. No pertinent family history.  Social History Social History  Substance Use Topics  . Smoking status: Never Smoker  . Smokeless tobacco: Never Used  . Alcohol use No    Review of Systems  Constitutional: Negative for fever. + Generalized body ache and fatigue Eyes: Negative for visual changes. ENT: Negative for sore throat. Cardiovascular: Negative for chest pain. Respiratory: Negative for shortness of breath. Gastrointestinal: Negative for abdominal pain or diarrhea. + vomiting Genitourinary: Negative for dysuria. Musculoskeletal: Negative for back pain. Skin: Negative for rash. Neurological: Negative for headaches, weakness or numbness.  ____________________________________________   PHYSICAL EXAM:  VITAL SIGNS: ED Triage Vitals  Enc Vitals Group     BP 12/10/15 1425 (!) 147/74     Pulse Rate 12/10/15 1425 75     Resp 12/10/15 1425 (!) 23     Temp 12/10/15 1425 99.6 F (37.6 C)     Temp Source 12/10/15 1425 Oral     SpO2 12/10/15 1425 95 %     Weight 12/10/15 1428 210 lb (95.3 kg)     Height 12/10/15 1428 5\' 7"  (1.702 m)     Head Circumference --      Peak Flow --      Pain Score --      Pain Loc --      Pain Edu? --      Excl. in GC? --  Constitutional: Alert and oriented. Well appearing and in no apparent distress. HEENT:      Head: Normocephalic and atraumatic.         Eyes: Conjunctivae are normal. Sclera is non-icteric. EOMI. PERRL      Mouth/Throat: Mucous membranes are moist.       Neck: Supple with no signs of meningismus. Cardiovascular: Regular rate and rhythm. No murmurs, gallops, or rubs. 2+ symmetrical distal pulses are present in all extremities. No JVD. Respiratory: Normal respiratory effort. Lungs are clear to auscultation bilaterally. No wheezes, crackles, or rhonchi.    Gastrointestinal: Soft, non tender, and non distended with positive bowel sounds. No rebound or guarding. Genitourinary: No CVA tenderness. Musculoskeletal: Bilateral pitting edema on lower extremities R>L and also erythema and warmth on the right lower extremity that is not present on the left. Neurologic: Normal speech and language. Face is symmetric. Moving all extremities. No gross focal neurologic deficits are appreciated. Skin: Skin is warm, dry and intact. No rash noted. Psychiatric: Mood and affect are normal. Speech and behavior are normal.  ____________________________________________   LABS (all labs ordered are listed, but only abnormal results are displayed)  Labs Reviewed  URINALYSIS COMPLETEWITH MICROSCOPIC ([facility] ONLY) - Abnormal; Notable for the following:       Result Value   Color, Urine YELLOW (*)    APPearance CLEAR (*)    Hgb urine dipstick 2+ (*)    Protein, ur 30 (*)    Nitrite POSITIVE (*)    Leukocytes, UA TRACE (*)    Squamous Epithelial / LPF 0-5 (*)    All other components within normal limits  CBC WITH DIFFERENTIAL/PLATELET - Abnormal; Notable for the following:    WBC 17.8 (*)    Platelets 115 (*)    Neutro Abs 16.7 (*)    Lymphs Abs 0.3 (*)    All other components within normal limits  COMPREHENSIVE METABOLIC PANEL - Abnormal; Notable for the following:    Glucose, Bld 173 (*)    BUN 29 (*)    Creatinine, Ser 1.50 (*)    Alkaline Phosphatase 132 (*)    Total Bilirubin 1.9 (*)    GFR calc non Af Amer 38 (*)    GFR calc Af Amer 44 (*)    All other components within normal limits  TROPONIN I - Abnormal; Notable for the following:    Troponin I 0.03 (*)    All other components within normal limits  LACTIC ACID, PLASMA - Abnormal; Notable for the following:    Lactic Acid, Venous 2.1 (*)    All other components within normal limits  PROTIME-INR - Abnormal; Notable for the following:    Prothrombin Time 26.9 (*)    All other components within  normal limits  CULTURE, BLOOD (ROUTINE X 2)  CULTURE, BLOOD (ROUTINE X 2)  URINE CULTURE   ____________________________________________  EKG  ED ECG REPORT I, Nita Sickle, the attending physician, personally viewed and interpreted this ECG.  Ventricular paced rhythm, rate of 76, normal intervals, right axis deviation, no ST elevations or depressions. ____________________________________________  RADIOLOGY  Doppler: Negative for DVT ____________________________________________   PROCEDURES  Procedure(s) performed: None Procedures Critical Care performed:  None ____________________________________________   INITIAL IMPRESSION / ASSESSMENT AND PLAN / ED COURSE   82 y.o. male history of CAD status post AICD, CHF (EF 45%), CKD, HTN, HLD presents for evaluation of generalized fatigue, body aches and one episode of vomiting since waking up this morning. Vital signs are within  normal limits with no fever (98.98F on my eval). Patient with asymmetric pitting edema bilateral lower extremities right greater than left also with warmth and erythema of the right lower extremity concerning for possible DVT versus cellulitis. Abdomen soft and nontender, lungs are clear to auscultation. Differential diagnoses included cellulitis versus DVT versus UTI versus pneumonia. We'll check labs, blood cultures, lactate, chest x-ray, ultrasound of his right lower extremity.  Clinical Course   Ultrasound negative for DVT. Patient with cellulitis and urinary tract infection, white count of 17.8 and a lactate of 2.1. Was given IV fluids, IV ceftriaxone and vancomycin. We'll admit for IV abx   Pertinent labs & imaging results that were available during my care of the patient were reviewed by me and considered in my medical decision making (see chart for details).    ____________________________________________   FINAL CLINICAL IMPRESSION(S) / ED DIAGNOSES  Final diagnoses:  Left leg swelling    UTI (lower urinary tract infection)  Cellulitis of right lower extremity      NEW MEDICATIONS STARTED DURING THIS VISIT:  New Prescriptions   No medications on file     Note:  This document was prepared using Dragon voice recognition software and may include unintentional dictation errors.    Nita Sickle, MD 12/10/15 1734

## 2015-12-10 NOTE — Progress Notes (Signed)
Pharmacy Antibiotic Note  Vincent [name] is a 82 y.o. male admitted on 12/10/2015 with cellulitis.  Pharmacy has been consulted for vancomycin dosing.  Plan: Vancomycin 1 gm IV x 1 in ED followed in approximately 12 hours by vancomycin 1.25 gm IV Q24H, predicted trough 16 mcg/mL. Pharmacy will continue to monitor and adjust as needed to maintain trough 15 to 20 mcg/mL (sepsis criteria met).  Vd 66.7 L, ke 0.032 hr-1, T1/2 21.7 hr  Height: 5' 7"  (170.2 cm) Weight: 210 lb (95.3 kg) IBW/kg (Calculated) : 66.1  Temp (24hrs), Avg:99.1 F (37.3 C), Min:98.5 F (36.9 C), Max:99.6 F (37.6 C)   Recent Labs Lab 12/10/15 1500 12/10/15 1501  WBC  --  17.8*  CREATININE  --  1.50*  LATICACIDVEN 2.1*  --     Estimated Creatinine Clearance: 33.1 mL/min (by C-G formula based on SCr of 1.5 mg/dL (H)).    Allergies  Allergen Reactions  . Atorvastatin     Other reaction(s): Unknown  . Codeine Nausea Only  . Meperidine Nausea Only    Thank you for allowing pharmacy to be a part of this patient's care.  Laural Benes, Pharm.D., BCPS Clinical Pharmacist 12/10/2015 8:16 PM

## 2015-12-11 LAB — BASIC METABOLIC PANEL
Anion gap: 8 (ref 5–15)
BUN: 28 mg/dL — AB (ref 6–20)
CO2: 23 mmol/L (ref 22–32)
CREATININE: 1.42 mg/dL — AB (ref 0.61–1.24)
Calcium: 8.4 mg/dL — ABNORMAL LOW (ref 8.9–10.3)
Chloride: 105 mmol/L (ref 101–111)
GFR calc Af Amer: 47 mL/min — ABNORMAL LOW (ref >60)
GFR, EST NON AFRICAN AMERICAN: 41 mL/min — AB (ref >60)
Glucose, Bld: 131 mg/dL — ABNORMAL HIGH (ref 65–99)
Potassium: 3.6 mmol/L (ref 3.5–5.1)
SODIUM: 136 mmol/L (ref 135–145)

## 2015-12-11 LAB — CBC
HCT: 36.9 % — ABNORMAL LOW (ref 40.0–52.0)
Hemoglobin: 13 g/dL (ref 13.0–18.0)
MCH: 32.1 pg (ref 26.0–34.0)
MCHC: 35.1 g/dL (ref 32.0–36.0)
MCV: 91.7 fL (ref 80.0–100.0)
PLATELETS: 96 10*3/uL — AB (ref 150–440)
RBC: 4.03 MIL/uL — ABNORMAL LOW (ref 4.40–5.90)
RDW: 14 % (ref 11.5–14.5)
WBC: 14.4 10*3/uL — AB (ref 3.8–10.6)

## 2015-12-11 LAB — PROTIME-INR
INR: 2.48
Prothrombin Time: 27.3 seconds — ABNORMAL HIGH (ref 11.4–15.2)

## 2015-12-11 NOTE — Clinical Social Work Note (Signed)
Clinical Social Work Assessment  Patient Details  Name: Vincent [name] MRN: [id number] Date of Birth: 12-May-1919  Date of referral:  12/11/15               Reason for consult:  Facility Placement                Permission sought to share information with:  [facility] Contact Representative Permission granted to share information::  Yes, Verbal Permission Granted  Name::        Agency::     Relationship::     Contact Information:     Housing/Transportation Living arrangements for the past 2 months:  Single Family Home Source of Information:  Adult Children Patient Interpreter Needed:  None Criminal Activity/Legal Involvement Pertinent to Current Situation/Hospitalization:  No - Comment as needed Significant Relationships:  Adult Children, Other Family Members Lives with:  Self Do you feel safe going back to the place where you live?  Yes Need for family participation in patient care:  No (Coment)  Care giving concerns:  Patient's family requested CSW   Social Worker assessment / plan:  Patient was sleeping when CSW visited bedside. CSW contacted his daughter, Vincent [name] [phone]) for further information.  Vincent [name] reported that she feels her father would be unsafe at home alone at this time unless he gains STR. CSW explained process for determining placement, and Vincent [name] gave verbal permission to start referral process, naming [facility] as the primary choice.  At baseline, the patient is independent in all ADLs including toileting, and he manages his medication. Vincent [name] and her children assist him with meals and visit him on a daily basis. The patient owns a cane and walker but refuses to use them outside of his home.   Vincent [name] and her husband are older adults with medical issues themselves, and cannot provide 24-hour supervision.    Employment status:  Retired Database administratornsurance information:  Managed Medicare PT Recommendations:  Not assessed at this time Information / Referral to community resources:   Skilled Nursing Facility  Patient/Family's Response to care:  Patient's daughter thanked CSW for assistance Patient/Family's Understanding of and Emotional Response to Diagnosis, Current Treatment, and Prognosis:  Patient's daughter reports that the patient may resist the idea of placement unless the medical team presents it as a need and as STR. She is not interested in pursuing LTC or ALF at this time.  Emotional Assessment Appearance:  Appears stated age Attitude/Demeanor/Rapport:   (Patient sleeping) Affect (typically observed):   (Patient sleeping) Orientation:   (Patient sleeping) Alcohol / Substance use:  Never Used Psych involvement (Current and /or in the community):  No (Comment)  Discharge Needs  Concerns to be addressed:  Discharge Planning Concerns Readmission within the last 30 days:  No Current discharge risk:  Lives alone Barriers to Discharge:  Continued Medical Work up   [employer] CorporationKaren M Bosco Paparella, LCSW 12/11/2015, 3:32 PM

## 2015-12-11 NOTE — Evaluation (Signed)
Physical Therapy Evaluation Patient Details Name: Vincent [name] MRN: [id number] DOB: Oct 07, 1919 Today's Date: 12/11/2015   History of Present Illness  82 yo male with onset of UTI and R foot cellulitis was admitted and demonstrating dificulty with walking and balance/strength.  PMHx: AICD, CHF, CAD, HTN, pacemaker, a-fib  Clinical Impression  Pt was seen for evaluation of mobility with a notable issue of pain on R foot, and has become unable to tolerate WBing for more than short trips with spasm in R quads and considerable heat in that limb.  Will anticipate he needs to transition to inpt therapy in SNF as he is living alone with no continual help to get home.  Higher fall risk and will follow acutely to increase LE strength and balance.    Follow Up Recommendations SNF    Equipment Recommendations  Rolling walker with 5" wheels (if current walker is not in good condition)    Recommendations for Other Services Rehab consult     Precautions / Restrictions Precautions Precautions: Fall;ICD/Pacemaker Restrictions Weight Bearing Restrictions: No      Mobility  Bed Mobility Overal bed mobility: Needs Assistance Bed Mobility: Supine to Sit;Sit to Supine     Supine to sit: Mod assist Sit to supine: Mod assist   General bed mobility comments: assisted his legs and trunk both directions  Transfers Overall transfer level: Needs assistance Equipment used: Rolling walker (2 wheeled);1 person hand held assist             General transfer comment: cued hand placement but does not follow instructions  Ambulation/Gait Ambulation/Gait assistance: Min assist Ambulation Distance (Feet): 3 Feet Assistive device: Rolling walker (2 wheeled);1 person hand held assist Gait Pattern/deviations: Step-to pattern;Trunk flexed (side of bed sidestepping) Gait velocity: reduced Gait velocity interpretation: Below normal speed for age/gender    Stairs            Wheelchair  Mobility    Modified Rankin (Stroke Patients Only)       Balance Overall balance assessment: Needs assistance Sitting-balance support: Feet supported Sitting balance-Leahy Scale: Good     Standing balance support: Bilateral upper extremity supported Standing balance-Leahy Scale: Poor                               Pertinent Vitals/Pain Pain Assessment: 0-10 Pain Score: 8  Pain Location: R foot and lower leg Pain Descriptors / Indicators: Aching;Sore Pain Intervention(s): Limited activity within patient's tolerance;Monitored during session;Premedicated before session;Repositioned    Home Living Family/patient expects to be discharged to:: Private residence Living Arrangements: Alone Available Help at Discharge: Family;Available PRN/intermittently Type of Home: House Home Access: Stairs to enter Entrance Stairs-Rails: Right Entrance Stairs-Number of Steps: 1 + 1 Home Layout: One level Home Equipment: Walker - 2 wheels;Cane - single point Additional Comments: Pt has limited equipment    Prior Function Level of Independence: Independent with assistive device(s) (was driving and golfing)               Hand Dominance        Extremity/Trunk Assessment   Upper Extremity Assessment: Generalized weakness           Lower Extremity Assessment: Generalized weakness      Cervical / Trunk Assessment: Normal  Communication   Communication: HOH (must see your lips to understand or speak at his ear)  Cognition Arousal/Alertness: Awake/alert Behavior During Therapy: WFL for tasks assessed/performed Overall Cognitive Status: Difficult to  assess       Memory: Decreased recall of precautions;Decreased short-term memory              General Comments General comments (skin integrity, edema, etc.): Pt is up to side of bed and asking to stay there but was not safe in appearance to allow this    Exercises     Assessment/Plan    PT Assessment  Patient needs continued PT services  PT Problem List Decreased strength;Decreased range of motion;Decreased activity tolerance;Decreased balance;Decreased mobility;Decreased coordination;Decreased knowledge of use of DME;Decreased cognition;Decreased safety awareness;Decreased knowledge of precautions;Pain;Decreased skin integrity;Obesity          PT Treatment Interventions DME instruction;Gait training;Stair training;Functional mobility training;Therapeutic activities;Therapeutic exercise;Balance training;Neuromuscular re-education;Patient/family education    PT Goals (Current goals can be found in the Care Plan section)  Acute Rehab PT Goals Patient Stated Goal: to get back to walking PT Goal Formulation: With patient/family Time For Goal Achievement: 12/24/15 Potential to Achieve Goals: Good    Frequency Min 2X/week   Barriers to discharge Inaccessible home environment;Decreased caregiver support family is not available to assist him continually    Co-evaluation               End of Session Equipment Utilized During Treatment: Gait belt Activity Tolerance: Patient tolerated treatment well;Patient limited by fatigue Patient left: in bed;with call bell/phone within reach;with bed alarm set;with family/visitor present;with nursing/sitter in room Nurse Communication: Mobility status         Time: 1540-1613 PT Time Calculation (min) (ACUTE ONLY): 33 min   Charges:   PT Evaluation $PT Eval Moderate Complexity: 1 Procedure PT Treatments $Gait Training: 8-22 mins   PT G CodesIvar Drape:        Alessandro Griep E 12/11/2015, 6:03 PM    Samul Dadauth Brezlyn Manrique, PT MS Acute Rehab Dept. Number: [facility] [phone] and [facility] [phone]

## 2015-12-11 NOTE — Plan of Care (Signed)
Problem: Safety: Goal: Ability to remain free from injury will improve Outcome: Progressing High fall risk with bed alarm on; pt ambulates with cane, offer assistance with ambulation.

## 2015-12-11 NOTE — Progress Notes (Signed)
[facility] [place] [facility]   PATIENT NAME: Vincent [name]    MRN#:  [id number]  DATE OF BIRTH:  Jul 09, 1919  SUBJECTIVE:  Hospital Day: 1 day Vincent [name] is a 82 y.o. male presenting with Fatigue and Urinary Tract Infection .   Overnight events: No acute overnight events Interval Events: No complaints this morning, states lower extremity edema stable  REVIEW OF SYSTEMS:  CONSTITUTIONAL: No fever, fatigue or weakness.  EYES: No blurred or double vision.  EARS, NOSE, AND THROAT: No tinnitus or ear pain.  RESPIRATORY: No cough, shortness of breath, wheezing or hemoptysis.  CARDIOVASCULAR: No chest pain, orthopnea, edema.  GASTROINTESTINAL: No nausea, vomiting, diarrhea or abdominal pain.  GENITOURINARY: No dysuria, hematuria.  ENDOCRINE: No polyuria, nocturia,  HEMATOLOGY: No anemia, easy bruising or bleeding SKIN:Right red foot No rash or lesion. MUSCULOSKELETAL: No joint pain or arthritis.   NEUROLOGIC: No tingling, numbness, weakness.  PSYCHIATRY: No anxiety or depression.   DRUG ALLERGIES:   Allergies  Allergen Reactions  . Atorvastatin     Other reaction(s): Unknown  . Codeine Nausea Only  . Meperidine Nausea Only    VITALS:  Blood pressure (!) 113/54, pulse 75, temperature 98.4 F (36.9 C), temperature source Oral, resp. rate 16, height 5\' 7"  (1.702 m), weight 98 kg (216 lb), SpO2 95 %.  PHYSICAL EXAMINATION:  VITAL SIGNS: Vitals:   12/11/15 0416 12/11/15 0854  BP: 122/64 (!) 113/54  Pulse: 77 75  Resp: (!) 36 16  Temp: 98.9 F (37.2 C) 98.4 F (36.9 C)   GENERAL:82 y.o.male currently in no acute distress.  HEAD: Normocephalic, atraumatic.  EYES: Pupils equal, round, reactive to light. Extraocular muscles intact. No scleral icterus.  MOUTH: Moist mucosal membrane. Dentition intact. No abscess noted.  EAR, NOSE, THROAT: Clear without exudates. No external lesions.  NECK: Supple. No thyromegaly. No nodules. No JVD.   PULMONARY: Clear to ascultation, without wheeze rails or rhonci. No use of accessory muscles, Good respiratory effort. good air entry bilaterally CHEST: Nontender to palpation.  CARDIOVASCULAR: S1 and S2. Regular rate and rhythm. No murmurs, rubs, or gallops. No edema. Pedal pulses 2+ bilaterally.  GASTROINTESTINAL: Soft, nontender, nondistended. No masses. Positive bowel sounds. No hepatosplenomegaly.  MUSCULOSKELETAL: No swelling, clubbing, or edema. Range of motion full in all extremities.  NEUROLOGIC: Cranial nerves II through XII are intact. No gross focal neurological deficits. Sensation intact. Reflexes intact.  SKIN: Erythema right foot warm to touch otherwiseNo ulceration, lesions, rashes, or cyanosis. Skin warm and dry. Turgor intact.  PSYCHIATRIC: Mood, affect within normal limits. The patient is awake, alert and oriented x 3. Insight, judgment intact.      LABORATORY PANEL:   CBC  Recent Labs Lab 12/11/15 0734  WBC 14.4*  HGB 13.0  HCT 36.9*  PLT 96*   ------------------------------------------------------------------------------------------------------------------  Chemistries   Recent Labs Lab 12/10/15 1501 12/11/15 0734  NA 136 136  K 3.7 3.6  CL 102 105  CO2 28 23  GLUCOSE 173* 131*  BUN 29* 28*  CREATININE 1.50* 1.42*  CALCIUM 9.0 8.4*  AST 30  --   ALT 20  --   ALKPHOS 132*  --   BILITOT 1.9*  --    ------------------------------------------------------------------------------------------------------------------  Cardiac Enzymes  Recent Labs Lab 12/10/15 1501  TROPONINI 0.03*   ------------------------------------------------------------------------------------------------------------------  RADIOLOGY:  Dg Chest 2 View  Result Date: 12/10/2015 CLINICAL DATA:  Lower extremity swelling EXAM: CHEST  2 VIEW COMPARISON:  06/02/2015 chest radiograph. FINDINGS: Stable configuration  of sternotomy wires and 3 lead right subclavian ICD. Stable  cardiomediastinal silhouette with mild cardiomegaly and aortic atherosclerosis. No pneumothorax. Trace bilateral pleural effusions. Mild pulmonary edema. No acute consolidative airspace disease. IMPRESSION: 1. Mild congestive heart failure. 2. Trace bilateral pleural effusions. 3. No acute consolidative airspace disease. 4. Aortic atherosclerosis. Electronically Signed   By: Delbert Phenix M.D.   On: 12/10/2015 16:33   US Venous Img Lower Unilateral Right  Result Date: 12/10/2015 CLINICAL DATA:  Right lower extremity edema EXAM: RIGHT LOWER EXTREMITY VENOUS DUPLEX ULTRASOUND TECHNIQUE: Gray-scale sonography with graded compression, as well as color Doppler and duplex ultrasound were performed to evaluate the right lower extremity deep venous system from the level of the common femoral vein and including the common femoral, femoral, profunda femoral, popliteal and calf veins including the posterior tibial, peroneal and gastrocnemius veins when visible. The superficial great saphenous vein was also interrogated. Spectral Doppler was utilized to evaluate flow at rest and with distal augmentation maneuvers in the common femoral, femoral and popliteal veins. COMPARISON:  June 03, 2015 FINDINGS: Contralateral Common Femoral Vein: Respiratory phasicity is normal and symmetric with the symptomatic side. No evidence of thrombus. Normal compressibility. Common Femoral Vein: No evidence of thrombus. Normal compressibility, respiratory phasicity and response to augmentation. Saphenofemoral Junction: No evidence of thrombus. Normal compressibility and flow on color Doppler imaging. Profunda Femoral Vein: No evidence of thrombus. Normal compressibility and flow on color Doppler imaging. Femoral Vein: No evidence of thrombus. Normal compressibility, respiratory phasicity and response to augmentation. Popliteal Vein: No evidence of thrombus. Normal compressibility, respiratory phasicity and response to augmentation. Calf Veins:  No evidence of thrombus in the posterior tibial vein. Right peroneal vein not well seen. Normal compressibility and flow on color Doppler imaging in visualized calf venous structures. Superficial Great Saphenous Vein: No evidence of thrombus. Normal compressibility and flow on color Doppler imaging. Venous Reflux:  None. Other Findings:  Soft tissue edema right lower extremity IMPRESSION: No evidence of right lower extremity deep venous thrombosis. Note that the right peroneal vein is not well seen due to lower extremity edema. Left common femoral vein also patent. Electronically Signed   By: Bretta Bang III M.D.   On: 12/10/2015 17:09    EKG:   Orders placed or performed during the hospital encounter of 12/10/15  . EKG 12-Lead  . EKG 12-Lead    ASSESSMENT AND PLAN:   Chosen Geske is a 82 y.o. male presenting with Fatigue and Urinary Tract Infection . Admitted 12/10/2015 : Day #: 1 day  1. Sepsis: Present on admission secondary to cellulitis, continue current antibiotics follow culture data 2. Essential hypertension: Metoprolol 3. Hyperlipidemia unspecified statin therapy    All the records are reviewed and case discussed with Care Management/Social Workerr. Management plans discussed with the patient, family and they are in agreement.  CODE STATUS: full TOTAL TIME TAKING CARE OF THIS PATIENT: 28 minutes.   POSSIBLE D/C IN 1-2DAYS, DEPENDING ON CLINICAL CONDITION.   Gerhart Ruggieri,  Mardi Mainland.D on 12/11/2015 at 11:33 AM  Between 7am to 6pm - Pager - [phone]  After 6pm: House Pager: - [phone]  [facility]  Office  [phone]  CC: Primary care physician; Danella Penton, MD

## 2015-12-11 NOTE — Progress Notes (Signed)
A&O patient from home, admitted to room 116 with Right lower extremity cellulitis. Pt brought to ED via EMS for increased fatigue and malaise. BL lower extremities noted with pitting edema left > right. Right foot red and warm to touch. Patient and family state that the edema is not new (X's 20 years) but that the redness IS new. Pt given IV vancomycin and rocephin in the ED. Education provided concerning diagnosis and antibiotics given; pt and family verbalized understanding.

## 2015-12-11 NOTE — NC FL2 (Signed)
[place] MEDICAID FL2 LEVEL OF CARE SCREENING TOOL     IDENTIFICATION  Patient Name: Vincent [name] Birthdate: 06-Nov-1919 Sex: male Admission Date (Current Location): 12/10/2015  [place]   Facility and Address:  [facility], [address], [place], [place] [address]      Provider Number: [id number]  Attending Physician Name and Address:  Wyatt Hasteavid K Hower, MD  Relative Name and Phone Number:       Current Level of Care: Hospital Recommended Level of Care: Skilled Nursing Facility Prior Approval Number:    Date Approved/Denied: 12/11/15 PASRR Number: [id number]  Discharge Plan: SNF    Current Diagnoses: Patient Active Problem List   Diagnosis Date Noted  . Cellulitis 12/10/2015  . Chest pain 06/03/2015    Orientation RESPIRATION BLADDER Height & Weight     Self, Time, Situation  Normal Continent Weight: 216 lb (98 kg) Height:  5\' 7"  (170.2 cm)  BEHAVIORAL SYMPTOMS/MOOD NEUROLOGICAL BOWEL NUTRITION STATUS   None  None Continent  Heart  AMBULATORY STATUS COMMUNICATION OF NEEDS Skin   Extensive Assist Verbally Normal                     Personal Care Assistance Level of Assistance  Bathing, Feeding Dressing Bathing Assistance: Limited assistance Feeding assistance: Independent Dressing Assistance: Limited assistance     Functional Limitations Info             SPECIAL CARE FACTORS FREQUENCY  PT (By licensed PT)     PT Frequency: 5X              Contractures Contractures Info: Present    Additional Factors Info  Allergies    Code Status   Allergies Info: Atorvastatin, Codeine, Meperidine    Code Info: Full Code       Current Medications (12/11/2015):  This is the current hospital active medication list Current Facility-Administered Medications  Medication Dose Route Frequency Provider Last Rate Last Dose  . acetaminophen (TYLENOL) tablet 650 mg  650 mg Oral Q6H PRN Houston SirenVivek J  Sainani, MD       Or  . acetaminophen (TYLENOL) suppository 650 mg  650 mg Rectal Q6H PRN Houston SirenVivek J Sainani, MD      . furosemide (LASIX) tablet 40 mg  40 mg Oral BID Houston SirenVivek J Sainani, MD   40 mg at 12/11/15 0902  . Influenza vac split quadrivalent PF (FLUARIX) injection 0.5 mL  0.5 mL Intramuscular Tomorrow-1000 Houston SirenVivek J Sainani, MD      . isosorbide mononitrate (IMDUR) 24 hr tablet 30 mg  30 mg Oral Daily Houston SirenVivek J Sainani, MD   30 mg at 12/11/15 0902  . metoprolol succinate (TOPROL-XL) 24 hr tablet 25 mg  25 mg Oral Daily Houston SirenVivek J Sainani, MD   25 mg at 12/11/15 0902  . ondansetron (ZOFRAN) tablet 4 mg  4 mg Oral Q6H PRN Houston SirenVivek J Sainani, MD       Or  . ondansetron [facility](ZOFRAN) injection 4 mg  4 mg Intravenous Q6H PRN Houston SirenVivek J Sainani, MD   4 mg at 12/10/15 2047  . pneumococcal 23 valent vaccine (PNU-IMMUNE) injection 0.5 mL  0.5 mL Intramuscular Tomorrow-1000 Houston SirenVivek J Sainani, MD      . potassium chloride SA (K-DUR,KLOR-CON) CR tablet 20 mEq  20 mEq Oral Daily Houston SirenVivek J Sainani, MD   20 mEq at 12/11/15 0902  . simvastatin (ZOCOR) tablet 40 mg  40 mg Oral Daily Vivek J  Sainani, MD   40 mg at 12/11/15 0902  . vancomycin (VANCOCIN) 1,250 mg in sodium chloride 0.9 % 250 mL IVPB  1,250 mg Intravenous Q24H Houston Siren, MD   1,250 mg at 12/11/15 0900  . warfarin (COUMADIN) tablet 4 mg  4 mg Oral q1800 Houston Siren, MD   4 mg at 12/10/15 2041  . Warfarin - Physician Dosing Inpatient   Does not apply [id number] Houston Siren, MD         Discharge Medications: Please see discharge summary for a list of discharge medications.  Relevant Imaging Results:  Relevant Lab Results:   Additional Information  SSN [id number]  Judi Cong, LCSW

## 2015-12-12 LAB — BASIC METABOLIC PANEL
ANION GAP: 3 — AB (ref 5–15)
BUN: 31 mg/dL — ABNORMAL HIGH (ref 6–20)
CALCIUM: 8.3 mg/dL — AB (ref 8.9–10.3)
CO2: 27 mmol/L (ref 22–32)
CREATININE: 1.35 mg/dL — AB (ref 0.61–1.24)
Chloride: 108 mmol/L (ref 101–111)
GFR, EST AFRICAN AMERICAN: 50 mL/min — AB (ref >60)
GFR, EST NON AFRICAN AMERICAN: 43 mL/min — AB (ref >60)
Glucose, Bld: 151 mg/dL — ABNORMAL HIGH (ref 65–99)
Potassium: 3.6 mmol/L (ref 3.5–5.1)
SODIUM: 138 mmol/L (ref 135–145)

## 2015-12-12 LAB — URINE CULTURE

## 2015-12-12 LAB — PROTIME-INR
INR: 2.59
PROTHROMBIN TIME: 28.3 s — AB (ref 11.4–15.2)

## 2015-12-12 MED ORDER — POLYETHYLENE GLYCOL 3350 17 G PO PACK: 17.0000 g | PACK | Freq: Every day | ORAL | Status: DC | PRN

## 2015-12-12 MED ORDER — DOCUSATE SODIUM 100 MG PO CAPS
100.0000 mg | ORAL_CAPSULE | Freq: Two times a day (BID) | ORAL | Status: DC
  Administered 2015-12-12 – 2015-12-13 (×3): 100 mg via ORAL
  Filled 2015-12-12 (×3): qty 1

## 2015-12-12 NOTE — Progress Notes (Signed)
Physical Therapy Treatment Patient Details Name: Vincent [name] MRN: [id number] DOB: Apr 02, 1919 Today's Date: 12/12/2015    History of Present Illness Pt. 82y.o. male presenting to the ED with fatigue and mailase, admitted for onset of UTI and R foot cellulitis, DVT ruled out in R LE.     PT Comments    Pt. Supine in bed upon arrival awake and alert. Pt. Very HOH, able to follow command with sufficient annunciation, demonstrates limited carry over of instruction such as reaching towards/pushing off stable seating surface with sit<>stand transfers. Pt. Demonstrates ability to perform multiple LE exercises. Gross strength WNL. Required mod A for bed mobility primarily for B UE support to pull to sitting. Able to perform sit<>stand transfers min A from multiple surfaces with repeated verbal cues for safe technique. Pt. Demonstrates improved functional use of RLE, stating no pain during today's session and demonstrating equal weight bearing through B LE with gait. Able to perform approx. 20 ft of gait CGA demonstrating step to pattern, slow cadence, use of RW for B UE assist, and ability to safely negotiate multiple obstacles in room without LOB or buckling. Would benefit from skilled PT to address above deficits and promote optimal return to PLOF. Recommend SNF placement upon d/c to follow up with further PT needs.    Follow Up Recommendations  SNF     Equipment Recommendations  Rolling walker with 5" wheels    Recommendations for Other Services       Precautions / Restrictions Precautions Precautions: Fall;ICD/Pacemaker Restrictions Weight Bearing Restrictions: No    Mobility  Bed Mobility Overal bed mobility: Needs Assistance Bed Mobility: Supine to Sit     Supine to sit: Mod assist (Pt. able to initiate movements of B LE and trunk, required assist of R UE from bedrail and L UE from PT to pull to sitting )     General bed mobility comments: Requires B UE assist to come to full  sitting position at EOB  Transfers Overall transfer level: Needs assistance Equipment used: Rolling walker (2 wheeled) Transfers: Sit to/from Stand Sit to Stand: Min assist         General transfer comment: Verbal cues for hand placement, multiple commands necesary for pt. to demonstrate understanding. Does not demonstrate follow through of understanding of this instruction  Ambulation/Gait Ambulation/Gait assistance: Min guard Ambulation Distance (Feet): 20 Feet Assistive device: Rolling walker (2 wheeled)       General Gait Details: Pt. able to take approx. 4 steps from EOB to chair with RW for B UE support step to pattern, able to ambulate in room approx. 20 feet step to pattern, slow cadence, demonstrates improved equal weight bearing through B LEs.    Stairs            Wheelchair Mobility    Modified Rankin (Stroke Patients Only)       Balance Overall balance assessment: Needs assistance Sitting-balance support: Feet supported Sitting balance-Leahy Scale: Good     Standing balance support: Bilateral upper extremity supported Standing balance-Leahy Scale: Good Standing balance comment: Pt. demonstrates good standing balance with B UE support from RW, able to negotiate multiple obstacles in room without LOB.                     Cognition Arousal/Alertness: Awake/alert Behavior During Therapy: WFL for tasks assessed/performed Overall Cognitive Status: Within Functional Limits for tasks assessed       Memory: Decreased recall of precautions  Exercises Other Exercises Other Exercises: supine exercises in bed B LEs x10; ankle pumps, SLR, heel slides, hip ab/add, seated exercise in chair B LE x10 marching and LAQ.     General Comments        Pertinent Vitals/Pain Pain Assessment: No/denies pain    Home Living                      Prior Function            PT Goals (current goals can now be found in the care plan  section) Acute Rehab PT Goals Patient Stated Goal: to get back to walking PT Goal Formulation: With patient/family Time For Goal Achievement: 12/24/15 Potential to Achieve Goals: Good Progress towards PT goals: Progressing toward goals    Frequency    Min 2X/week      PT Plan Current plan remains appropriate    Co-evaluation             End of Session Equipment Utilized During Treatment: Gait belt Activity Tolerance: Patient tolerated treatment well;Patient limited by fatigue Patient left: in chair;with chair alarm set;with call bell/phone within reach;with family/visitor present (pt. daughter in room)     Time: 1610-9604 PT Time Calculation (min) (ACUTE ONLY): 22 min  Charges:                       G CodesHuntley Dec 2015-12-23, 11:07 AM

## 2015-12-12 NOTE — Progress Notes (Signed)
CSW attempted to present bed offers to patient's daughter Fannie KneeSue [phone]. Could not leave voicemail due to it not being set up yet. Left voicemail for Huntley DecSara [phone]. Awaiting phone cal back.   Woodroe Modehristina Eldridge Marcott, MSW, LCSW, LCAS-A Clinical Social Worker [phone]

## 2015-12-12 NOTE — Care Management Important Message (Signed)
Important Message  Patient Details  Name: Jeanell Sparrowhomas D Buchler MRN: [id number] Date of Birth: [birth year]   Medicare Important Message Given:  Yes    Gwenette GreetBrenda S Anaiah Mcmannis, RN 12/12/2015, 11:54 AM

## 2015-12-12 NOTE — Progress Notes (Addendum)
[facility] [place] at [facility]   PATIENT NAME: Vincent [name] Mooneyhan    MRN#:  [id number]  DATE OF BIRTH:  30-Oct-1919  SUBJECTIVE:  Hospital Day: 2 days Vincent [name] Bednarczyk is a 82 y.o. male presenting with Fatigue and Urinary Tract Infection .   Overnight events: No acute overnight events Interval Events: No complaints this morning, States foot is improving, son at bedside and  REVIEW OF SYSTEMS:  CONSTITUTIONAL: No fever, fatigue or weakness.  EYES: No blurred or double vision.  EARS, NOSE, AND THROAT: No tinnitus or ear pain.  RESPIRATORY: No cough, shortness of breath, wheezing or hemoptysis.  CARDIOVASCULAR: No chest pain, orthopnea, edema.  GASTROINTESTINAL: No nausea, vomiting, diarrhea or abdominal pain.  GENITOURINARY: No dysuria, hematuria.  ENDOCRINE: No polyuria, nocturia,  HEMATOLOGY: No anemia, easy bruising or bleeding SKIN:Improved Right red foot No rash or lesion. MUSCULOSKELETAL: No joint pain or arthritis.   NEUROLOGIC: No tingling, numbness, weakness.  PSYCHIATRY: No anxiety or depression.   DRUG ALLERGIES:   Allergies  Allergen Reactions  . Atorvastatin     Other reaction(s): Unknown  . Codeine Nausea Only  . Meperidine Nausea Only    VITALS:  Blood pressure 123/60, pulse 74, temperature 98.2 F (36.8 C), temperature source Oral, resp. rate 16, height 5\' 7"  (1.702 m), weight 98 kg (216 lb), SpO2 97 %.  PHYSICAL EXAMINATION:  VITAL SIGNS: Vitals:   12/12/15 0506 12/12/15 0901  BP: 114/65 123/60  Pulse: 75 74  Resp:  16  Temp: 98.3 F (36.8 C) 98.2 F (36.8 C)   GENERAL:82 y.o.male currently in no acute distress.  HEAD: Normocephalic, atraumatic.  EYES: Pupils equal, round, reactive to light. Extraocular muscles intact. No scleral icterus.  MOUTH: Moist mucosal membrane. Dentition intact. No abscess noted.  EAR, NOSE, THROAT: Clear without exudates. No external lesions.  NECK: Supple. No thyromegaly. No nodules. No JVD.   PULMONARY: Scant basilar rhonchi without wheeze rails or rhonci. No use of accessory muscles, Good respiratory effort. good air entry bilaterally CHEST: Nontender to palpation.  CARDIOVASCULAR: S1 and S2. Regular rate and rhythm. No murmurs, rubs, or gallops. No edema. Pedal pulses 2+ bilaterally.  GASTROINTESTINAL: Soft, nontender, nondistended. No masses. Positive bowel sounds. No hepatosplenomegaly.  MUSCULOSKELETAL: No swelling, clubbing, or edema. Range of motion full in all extremities.  NEUROLOGIC: Cranial nerves II through XII are intact. No gross focal neurological deficits. Sensation intact. Reflexes intact.  SKIN: Improving Erythema right foot warm to touch otherwiseNo ulceration, lesions, rashes, or cyanosis. Skin warm and dry. Turgor intact.  PSYCHIATRIC: Mood, affect within normal limits. The patient is awake, alert and oriented x 3. Insight, judgment intact.      LABORATORY PANEL:   CBC  Recent Labs Lab 12/11/15 0734  WBC 14.4*  HGB 13.0  HCT 36.9*  PLT 96*   ------------------------------------------------------------------------------------------------------------------  Chemistries   Recent Labs Lab 12/10/15 1501  12/12/15 0445  NA 136  < > 138  K 3.7  < > 3.6  CL 102  < > 108  CO2 28  < > 27  GLUCOSE 173*  < > 151*  BUN 29*  < > 31*  CREATININE 1.50*  < > 1.35*  CALCIUM 9.0  < > 8.3*  AST 30  --   --   ALT 20  --   --   ALKPHOS 132*  --   --   BILITOT 1.9*  --   --   < > = values in this interval not  displayed. ------------------------------------------------------------------------------------------------------------------  Cardiac Enzymes  Recent Labs Lab 12/10/15 1501  TROPONINI 0.03*   ------------------------------------------------------------------------------------------------------------------  RADIOLOGY:  Dg Chest 2 View  Result Date: 12/10/2015 CLINICAL DATA:  Lower extremity swelling EXAM: CHEST  2 VIEW COMPARISON:   06/02/2015 chest radiograph. FINDINGS: Stable configuration of sternotomy wires and 3 lead right subclavian ICD. Stable cardiomediastinal silhouette with mild cardiomegaly and aortic atherosclerosis. No pneumothorax. Trace bilateral pleural effusions. Mild pulmonary edema. No acute consolidative airspace disease. IMPRESSION: 1. Mild congestive heart failure. 2. Trace bilateral pleural effusions. 3. No acute consolidative airspace disease. 4. Aortic atherosclerosis. Electronically Signed   By: Delbert Phenix M.D.   On: 12/10/2015 16:33   US Venous Img Lower Unilateral Right  Result Date: 12/10/2015 CLINICAL DATA:  Right lower extremity edema EXAM: RIGHT LOWER EXTREMITY VENOUS DUPLEX ULTRASOUND TECHNIQUE: Gray-scale sonography with graded compression, as well as color Doppler and duplex ultrasound were performed to evaluate the right lower extremity deep venous system from the level of the common femoral vein and including the common femoral, femoral, profunda femoral, popliteal and calf veins including the posterior tibial, peroneal and gastrocnemius veins when visible. The superficial great saphenous vein was also interrogated. Spectral Doppler was utilized to evaluate flow at rest and with distal augmentation maneuvers in the common femoral, femoral and popliteal veins. COMPARISON:  June 03, 2015 FINDINGS: Contralateral Common Femoral Vein: Respiratory phasicity is normal and symmetric with the symptomatic side. No evidence of thrombus. Normal compressibility. Common Femoral Vein: No evidence of thrombus. Normal compressibility, respiratory phasicity and response to augmentation. Saphenofemoral Junction: No evidence of thrombus. Normal compressibility and flow on color Doppler imaging. Profunda Femoral Vein: No evidence of thrombus. Normal compressibility and flow on color Doppler imaging. Femoral Vein: No evidence of thrombus. Normal compressibility, respiratory phasicity and response to augmentation. Popliteal  Vein: No evidence of thrombus. Normal compressibility, respiratory phasicity and response to augmentation. Calf Veins: No evidence of thrombus in the posterior tibial vein. Right peroneal vein not well seen. Normal compressibility and flow on color Doppler imaging in visualized calf venous structures. Superficial Great Saphenous Vein: No evidence of thrombus. Normal compressibility and flow on color Doppler imaging. Venous Reflux:  None. Other Findings:  Soft tissue edema right lower extremity IMPRESSION: No evidence of right lower extremity deep venous thrombosis. Note that the right peroneal vein is not well seen due to lower extremity edema. Left common femoral vein also patent. Electronically Signed   By: Bretta Bang III M.D.   On: 12/10/2015 17:09    EKG:   Orders placed or performed during the hospital encounter of 12/10/15  . EKG 12-Lead  . EKG 12-Lead    ASSESSMENT AND PLAN:   Leif Loflin is a 82 y.o. male presenting with Fatigue and Urinary Tract Infection . Admitted 12/10/2015 : Day #: 2 days  1. Sepsis: Present on admission secondary to cellulitis, continue current antibiotics follow culture data 2. Essential hypertension: Metoprolol 3. Hyperlipidemia unspecified statin therapy  Disposition: SNF  All the records are reviewed and case discussed with Care Management/Social Workerr. Management plans discussed with the patient, family and they are in agreement.  CODE STATUS: full TOTAL TIME TAKING CARE OF THIS PATIENT: 28 minutes.   POSSIBLE D/C IN 1-2DAYS, DEPENDING ON CLINICAL CONDITION.   Hower,  Mardi Mainland.D on 12/12/2015 at 11:49 AM  Between 7am to 6pm - Pager - [phone]  After 6pm: House Pager: - [phone]  [facility]  Office  [phone]  CC: Primary care physician; Loraine Leriche  Sherlene Shams, MD

## 2015-12-13 ENCOUNTER — Encounter
Admission: RE | Admit: 2015-12-13 | Discharge: 2015-12-13 | Disposition: A | Discharge status: Home / Self Care | Payer: Medicare PPO | Source: Ambulatory Visit | Attending: Internal Medicine | Admitting: Internal Medicine

## 2015-12-13 LAB — PROTIME-INR
INR: 2.85
Prothrombin Time: 30.5 seconds — ABNORMAL HIGH (ref 11.4–15.2)

## 2015-12-13 MED ORDER — CEPHALEXIN 500 MG PO CAPS: 500.0000 mg | ORAL_CAPSULE | Freq: Two times a day (BID) | ORAL | 0 refills | Status: AC

## 2015-12-13 MED ORDER — POLYETHYLENE GLYCOL 3350 17 G PO PACK: 17.0000 g | PACK | Freq: Every day | ORAL | 0 refills | Status: AC | PRN

## 2015-12-13 MED ORDER — DOCUSATE SODIUM 100 MG PO CAPS: 100.0000 mg | ORAL_CAPSULE | Freq: Two times a day (BID) | ORAL | 0 refills | Status: AC

## 2015-12-13 NOTE — Progress Notes (Signed)
Auth received [id number] informed [facility].   Woodroe Modehristina Rowen Wilmer, MSW, LCSW, LCAS-A Clinical Social Worker [phone]

## 2015-12-13 NOTE — Progress Notes (Addendum)
Clinical Social Worker was informed that patient will be medically ready to [facility]. Patient and family are in a agreement with plan. [facility] to confirm that [facility]'s bed is ready. Provided patient's room number 210 and number to call for report [phone] . All discharge [facility] via [facility].  RN will call report and [facility].  Woodroe Modehristina Shanece Cochrane, MSW, LCSW, LCAS-A Clinical Social Worker [phone]

## 2015-12-13 NOTE — Clinical Social Work Placement (Signed)
   CLINICAL SOCIAL WORK PLACEMENT  NOTE  Date:  12/13/2015  Patient Details  Name: Vincent [name] MRN: [id number] Date of Birth: 19-May-1919  Clinical Social Work is seeking post-discharge placement for this patient at the Skilled  Nursing Facility level of care (*CSW will initial, date and re-position this form in  chart as items are completed):  Yes   Patient/family provided with [place] Clinical Social Work Department's list of facilities offering this level of care within the geographic area requested by the patient (or if unable, by the patient's family).  Yes   Patient/family informed of their freedom to choose among providers that offer the needed level of care, that participate in Medicare, Medicaid or managed care program needed by the patient, have an available bed and are willing to accept the patient.  Yes   Patient/family informed of [facility]'s ownership interest in [facility] and [facility], as well as of the fact that they are under no obligation to receive care at these facilities.  PASRR submitted to EDS on       PASRR number received on       Existing PASRR number confirmed on 12/11/15     FL2 transmitted to all facilities in geographic area requested by pt/family on 12/11/15     FL2 transmitted to all facilities within larger geographic area on       Patient informed that his/her managed care company has contracts with or will negotiate with certain facilities, including the following:        Yes   Patient/family informed of bed offers received.  Patient chooses bed at  [facility])     Physician recommends and patient chooses bed at      Patient to be transferred to  [facility]) on 12/13/15.  Patient to be transferred to facility by  (EMS)     Patient family notified on 12/13/15 of transfer.  Name of family member notified:   (Daughter)     PHYSICIAN       Additional Comment:     _______________________________________________ Idamae Lusherhristina E Harpreet Pompey, LCSW 12/13/2015, 11:04 AM

## 2015-12-13 NOTE — Progress Notes (Signed)
CSW faxed note written by RN about patient's ability [facility]. Called [facility] to be connected to patient's case Manager. Stated they received the information and that it's in process of being forwarded to patient's case manager. Awaiting phone call back.  Woodroe Modehristina Gearline Spilman, MSW, LCSW, LCAS-A Clinical Social Worker [phone]

## 2015-12-13 NOTE — Progress Notes (Signed)
Presented bed offers. Accept[facility] bed [facility]. Left voicemail. Awaiting phone call back. Requested RN to input a note about patient's ability to complete ADLs for insurance auth. Awaiting for RN to complete note. CSW will continue to follow and assist.  Woodroe Modehristina Osei Anger, MSW, LCSW, LCAS-A Clinical Social Worker [phone]

## 2015-12-13 NOTE — Discharge Summary (Signed)
[facility] [place] at [facility]   PATIENT NAME: Vincent [name]    MR#:  [id number]  DATE OF BIRTH:  1919-11-01  DATE OF ADMISSION:  12/10/2015 ADMITTING PHYSICIAN: Houston Siren, MD  DATE OF DISCHARGE: 12/13/15  PRIMARY CARE PHYSICIAN: Danella Penton, MD    ADMISSION DIAGNOSIS:   Cellulitis of right lower extremity [L03.115] Left leg swelling [M79.89] Right leg swelling [M79.89]  DISCHARGE DIAGNOSIS:  Active Problems:   Cellulitis right leg  SECONDARY DIAGNOSIS:   Past Medical History:  Diagnosis Date  . AICD (automatic cardioverter/defibrillator) present   . Atrial fibrillation (HCC)   . CHF (congestive heart failure) (HCC)   . Coronary artery disease   . Presence of permanent cardiac pacemaker     HOSPITAL COURSE:  Vincent [name]  is a 82 y.o. male admitted 12/10/2015 with chief complaint Fatigue. Please see H&P performed by Houston Siren, MD for further information. Patient presented with above symptoms in addition to right leg redness. Patient meeting sepsis criteria on admission, started on broad antibiotics. Condition improved, antibiotics tapered, PT eval recommended SNF placement - patient and family are in agreement  DISCHARGE CONDITIONS:   stable  CONSULTS OBTAINED:    DRUG ALLERGIES:   Allergies  Allergen Reactions  . Atorvastatin     Other reaction(s): Unknown  . Codeine Nausea Only  . Meperidine Nausea Only    DISCHARGE MEDICATIONS:   Current Discharge Medication List    START taking these medications   Details  cephALEXin (KEFLEX) 500 MG capsule Take 1 capsule (500 mg total) by mouth 2 (two) times daily. Qty: 10 capsule, Refills: 0    docusate sodium (COLACE) 100 MG capsule Take 1 capsule (100 mg total) by mouth 2 (two) times daily. Qty: 10 capsule, Refills: 0    polyethylene glycol (MIRALAX / GLYCOLAX) packet Take 17 g by mouth daily as needed for mild constipation or moderate constipation. Qty: 14 each,  Refills: 0      CONTINUE these medications which have NOT CHANGED   Details  aspirin EC 81 MG tablet Take 81 mg by mouth daily.    furosemide (LASIX) 40 MG tablet Take 1 tablet by mouth 2 (two) times daily.    galantamine (RAZADYNE) 4 MG tablet Take 1 tablet by mouth every morning.    KLOR-CON M20 20 MEQ tablet Take 1 tablet by mouth daily.    metoprolol succinate (TOPROL-XL) 25 MG 24 hr tablet Take 1 tablet by mouth daily.    simvastatin (ZOCOR) 40 MG tablet Take 1 tablet by mouth daily at 6 PM.     warfarin (COUMADIN) 4 MG tablet Take 1 tablet by mouth daily at 6 PM.       STOP taking these medications     isosorbide mononitrate (IMDUR) 30 MG 24 hr tablet          DISCHARGE INSTRUCTIONS:    DIET:  Cardiac diet  DISCHARGE CONDITION:  Stable  ACTIVITY:  Activity as tolerated  OXYGEN:  Home Oxygen: No.   Oxygen Delivery: room air  DISCHARGE LOCATION:  nursing home   If you experience worsening of your admission symptoms, develop shortness of breath, life threatening emergency, suicidal or homicidal thoughts you must seek medical attention immediately by calling 911 or calling your MD immediately  if symptoms less severe.  You Must read complete instructions/literature along with all the possible adverse reactions/side effects for all the Medicines you take and that have been prescribed to you. Take  any new Medicines after you have completely understood and accpet all the possible adverse reactions/side effects.   Please note  You were cared for by a hospitalist during your hospital stay. If you have any questions about your discharge medications or the care you received while you were in the hospital after you are discharged, you can call the unit and asked to speak with the hospitalist on call if the hospitalist that took care of you is not available. Once you are discharged, your primary care physician will handle any further medical issues. Please note that NO  REFILLS for any discharge medications will be authorized once you are discharged, as it is imperative that you return to your primary care physician (or establish a relationship with a primary care physician if you do not have one) for your aftercare needs so that they can reassess your need for medications and monitor your lab values.    On the day of Discharge:   VITAL SIGNS:  Blood pressure (!) 142/73, pulse 76, temperature 98 F (36.7 C), temperature source Oral, resp. rate (!) 28, height 5\' 7"  (1.702 m), weight 98 kg (216 lb), SpO2 97 %.  I/O:   Intake/Output Summary (Last 24 hours) at 12/13/15 0919 Last data filed at 12/13/15 0826  Gross per 24 hour  Intake              480 ml  Output              900 ml  Net             -420 ml    PHYSICAL EXAMINATION:  GENERAL:  82 y.o.-year-old patient lying in the bed with no acute distress.  EYES: Pupils equal, round, reactive to light and accommodation. No scleral icterus. Extraocular muscles intact.  HEENT: Head atraumatic, normocephalic. Oropharynx and nasopharynx clear.  NECK:  Supple, no jugular venous distention. No thyroid enlargement, no tenderness.  LUNGS: Normal breath sounds bilaterally, no wheezing, rales,rhonchi or crepitation. No use of accessory muscles of respiration.  CARDIOVASCULAR: S1, S2 normal. No murmurs, rubs, or gallops.  ABDOMEN: Soft, non-tender, non-distended. Bowel sounds present. No organomegaly or mass.  EXTREMITIES: trace pedal edema,no cyanosis, or clubbing.  NEUROLOGIC: Cranial nerves II through XII are intact. Muscle strength 5/5 in all extremities. Sensation intact. Gait not checked.  PSYCHIATRIC: The patient is alert and oriented x 3.  SKIN: No obvious rash, lesion, or ulcer.   DATA REVIEW:   CBC  Recent Labs Lab 12/11/15 0734  WBC 14.4*  HGB 13.0  HCT 36.9*  PLT 96*    Chemistries   Recent Labs Lab 12/10/15 1501  12/12/15 0445  NA 136  < > 138  K 3.7  < > 3.6  CL 102  < > 108  CO2  28  < > 27  GLUCOSE 173*  < > 151*  BUN 29*  < > 31*  CREATININE 1.50*  < > 1.35*  CALCIUM 9.0  < > 8.3*  AST 30  --   --   ALT 20  --   --   ALKPHOS 132*  --   --   BILITOT 1.9*  --   --   < > = values in this interval not displayed.  Cardiac Enzymes  Recent Labs Lab 12/10/15 1501  TROPONINI 0.03*    Microbiology Results  Results for orders placed or performed during the hospital encounter of 12/10/15  Urine culture     Status: Abnormal  Collection Time: 12/10/15  2:24 PM  Result Value Ref Range Status   Specimen Description URINE, RANDOM  Final   Special Requests NONE  Final   Culture MULTIPLE SPECIES PRESENT, SUGGEST RECOLLECTION (A)  Final   Report Status 12/12/2015 FINAL  Final  Blood culture (routine x 2)     Status: None (Preliminary result)   Collection Time: 12/10/15  3:00 PM  Result Value Ref Range Status   Specimen Description BLOOD LEFT FOREARM  Final   Special Requests BOTTLES DRAWN AEROBIC AND ANAEROBIC 5CC  Final   Culture NO GROWTH < 24 HOURS  Final   Report Status PENDING  Incomplete  Blood culture (routine x 2)     Status: None (Preliminary result)   Collection Time: 12/10/15  6:20 PM  Result Value Ref Range Status   Specimen Description BLOOD LEFT FOREARM  Final   Special Requests BOTTLES DRAWN AEROBIC AND ANAEROBIC  2CC  Final   Culture NO GROWTH < 24 HOURS  Final   Report Status PENDING  Incomplete    RADIOLOGY:  No results found.   Management plans discussed with the patient, family and they are in agreement.  CODE STATUS:     Code Status Orders        Start     Ordered   12/10/15 2006  Full code  Continuous     12/10/15 2005    Code Status History    Date Active Date Inactive Code Status Order ID Comments User Context   06/03/2015  4:38 AM 06/03/2015  3:03 PM Full Code [id number]  Enedina Finner, MD Inpatient      TOTAL TIME TAKING CARE OF THIS PATIENT: 33 minutes.    Hower,  Mardi Mainland.D on 12/13/2015 at 9:19 AM  Between 7am to  6pm - Pager - [phone]  After 6pm go to [email or web address] - Social research officer, government  [facility] [place] [facility]  Office  [phone]  CC: Primary care physician; Danella Penton, MD

## 2015-12-13 NOTE — Progress Notes (Signed)
Patient is able to feed self.  Patient needs assistance with sitting up in bed and moving objects such as the bedside table, patient needs assistance when reaching toward or pushing off stable surfaces when transferring.   Patient needs rolling walking with assist when ambulating in room.

## 2015-12-13 NOTE — Progress Notes (Signed)
Called report to Vernon [facility]le Blackwell L[facility], patient going to room 210.

## 2015-12-15 LAB — CULTURE, BLOOD (ROUTINE X 2)
CULTURE: NO GROWTH
Culture: NO GROWTH

## 2015-12-18 ENCOUNTER — Encounter
Admission: RE | Admit: 2015-12-18 | Discharge: 2015-12-18 | Disposition: A | Discharge status: Home / Self Care | Payer: Medicare PPO | Source: Ambulatory Visit | Attending: Internal Medicine | Admitting: Internal Medicine

## 2015-12-18 DIAGNOSIS — I480 Paroxysmal atrial fibrillation: Secondary | ICD-10-CM | POA: Insufficient documentation

## 2015-12-19 ENCOUNTER — Non-Acute Institutional Stay (SKILLED_NURSING_FACILITY): Payer: Medicare PPO | Admitting: Gerontology

## 2015-12-19 DIAGNOSIS — L03115 Cellulitis of right lower limb: Secondary | ICD-10-CM

## 2015-12-19 DIAGNOSIS — Z5181 Encounter for therapeutic drug level monitoring: Secondary | ICD-10-CM | POA: Diagnosis not present

## 2015-12-20 DIAGNOSIS — I480 Paroxysmal atrial fibrillation: Secondary | ICD-10-CM | POA: Diagnosis not present

## 2015-12-20 LAB — COMPREHENSIVE METABOLIC PANEL
ALT: 31 U/L (ref 17–63)
AST: 30 U/L (ref 15–41)
Albumin: 3.2 g/dL — ABNORMAL LOW (ref 3.5–5.0)
Alkaline Phosphatase: 159 U/L — ABNORMAL HIGH (ref 38–126)
Anion gap: 8 (ref 5–15)
BILIRUBIN TOTAL: 0.6 mg/dL (ref 0.3–1.2)
BUN: 19 mg/dL (ref 6–20)
CHLORIDE: 105 mmol/L (ref 101–111)
CO2: 28 mmol/L (ref 22–32)
CREATININE: 1.1 mg/dL (ref 0.61–1.24)
Calcium: 8.7 mg/dL — ABNORMAL LOW (ref 8.9–10.3)
GFR calc Af Amer: >60 mL/min (ref >60)
GFR, EST NON AFRICAN AMERICAN: 55 mL/min — AB (ref >60)
Glucose, Bld: 98 mg/dL (ref 65–99)
POTASSIUM: 3.3 mmol/L — AB (ref 3.5–5.1)
Sodium: 141 mmol/L (ref 135–145)
Total Protein: 7.1 g/dL (ref 6.5–8.1)

## 2015-12-20 LAB — CBC WITH DIFFERENTIAL/PLATELET
Basophils Absolute: 0 10*3/uL (ref 0–0.1)
Basophils Relative: 1 %
Eosinophils Absolute: 0.4 10*3/uL (ref 0–0.7)
Eosinophils Relative: 5 %
HEMATOCRIT: 41.7 % (ref 40.0–52.0)
HEMOGLOBIN: 14.6 g/dL (ref 13.0–18.0)
LYMPHS ABS: 1.6 10*3/uL (ref 1.0–3.6)
LYMPHS PCT: 21 %
MCH: 32.2 pg (ref 26.0–34.0)
MCHC: 35.1 g/dL (ref 32.0–36.0)
MCV: 91.8 fL (ref 80.0–100.0)
MONOS PCT: 10 %
Monocytes Absolute: 0.8 10*3/uL (ref 0.2–1.0)
NEUTROS ABS: 4.8 10*3/uL (ref 1.4–6.5)
NEUTROS PCT: 63 %
Platelets: 219 10*3/uL (ref 150–440)
RBC: 4.54 MIL/uL (ref 4.40–5.90)
RDW: 14 % (ref 11.5–14.5)
WBC: 7.6 10*3/uL (ref 3.8–10.6)

## 2015-12-20 LAB — PROTIME-INR
INR: 3.61
PROTHROMBIN TIME: 36.9 s — AB (ref 11.4–15.2)

## 2015-12-20 NOTE — Progress Notes (Signed)
Location:      Place of Service:  SNF (31) Provider:  Lorenso QuarryShannon Dontavion Noxon, NP-C  Danella PentonMark F Miller, MD  Patient Care Team: Danella PentonMark F Miller, MD as PCP - General (Internal Medicine)  Extended Emergency Contact Information Primary Emergency Contact: Lucia GaskinsBrown,Sara Address: [address]          [place], [place] [id number] [place] Home Phone: [phone] Relation: None Secondary Emergency Contact: Willadean CarolBrown,Tommy  [place] Home Phone: [phone] Relation: Grandson  Code Status:  Full Goals of care: Advanced Directive information Advanced Directives 12/10/2015  Does patient have an advance directive? No  Would patient like information on creating an advanced directive? No - patient declined information     Chief Complaint  Patient presents with  . Follow-up    HPI:  Pt is a 82 y.o. male seen today for medical management of chronic diseases and for management of Coumadin (Warfarin) for VTE Prophylaxis in the setting of a-fib. Pt has been complaint with medication regimen and is aware of dietary modifications needed. Pt is aware of importance of continued compliance with medication and testing. Pt has not displayed any adverse effects related to anticoagulant therapy such as unexplained or excessive bleeding, bruising, hematuria, hematemesis, melena. Heart rate is controlled. Vital signs are stable. Cellulitis appears to be resolving. Skin is thick, dry and wrinkled. Pt reports legs are feeling better and he is trying to keep them elevated.    Past Medical History:  Diagnosis Date  . AICD (automatic cardioverter/defibrillator) present   . Atrial fibrillation (HCC)   . CHF (congestive heart failure) (HCC)   . Coronary artery disease   . Presence of permanent cardiac pacemaker    No past surgical history on file.  Allergies  Allergen Reactions  . Atorvastatin     Other reaction(s): Unknown  . Codeine Nausea Only  . Meperidine Nausea Only      Medication List         Accurate as of 12/19/15 11:59 PM. Always use your most recent med list.          aspirin EC 81 MG tablet Take 81 mg by mouth daily.   docusate sodium 100 MG capsule Commonly known as:  COLACE Take 1 capsule (100 mg total) by mouth 2 (two) times daily.   furosemide 40 MG tablet Commonly known as:  LASIX Take 1 tablet by mouth 2 (two) times daily.   galantamine 4 MG tablet Commonly known as:  RAZADYNE Take 1 tablet by mouth every morning.   KLOR-CON M20 20 MEQ tablet Generic drug:  potassium chloride SA Take 1 tablet by mouth daily.   metoprolol succinate 25 MG 24 hr tablet Commonly known as:  TOPROL-XL Take 1 tablet by mouth daily.   polyethylene glycol packet Commonly known as:  MIRALAX / GLYCOLAX Take 17 g by mouth daily as needed for mild constipation or moderate constipation.   simvastatin 40 MG tablet Commonly known as:  ZOCOR Take 1 tablet by mouth daily at 6 PM.   warfarin 4 MG tablet Commonly known as:  COUMADIN Take 1 tablet by mouth daily at 6 PM.       Review of Systems  Constitutional: Negative for activity change, appetite change, chills, diaphoresis and fever.  Respiratory: Negative for apnea, cough, choking, chest tightness, shortness of breath and wheezing.   Cardiovascular: Negative for chest pain, palpitations and leg swelling.  Musculoskeletal: Negative for back pain, gait problem and myalgias. Arthralgias: typical arthritis.  Skin: Negative for  color change, pallor, rash and wound.  Neurological: Negative for dizziness, tremors, syncope, speech difficulty, weakness, numbness and headaches.  Psychiatric/Behavioral: Negative for agitation and behavioral problems.  All other systems reviewed and are negative.   Immunization History  Administered Date(s) Administered  . Influenza,inj,Quad PF,36+ Mos 12/11/2015  . Pneumococcal Polysaccharide-23 12/12/2015   Pertinent  Health Maintenance Due  Topic Date Due  . PNA vac Low Risk Adult (2 of 2 -  PCV13) 12/11/2016  . INFLUENZA VACCINE  Completed   No flowsheet data found. Functional Status Survey:    Vitals:   12/19/15 0543  BP: (!) 154/86  Pulse: 74  Resp: 20  Temp: 97.7 F (36.5 C)  SpO2: 98%  Weight: 206 lb 1.6 oz (93.5 kg)   Body mass index is 32.28 kg/m. Physical Exam  Constitutional: He is oriented to person, place, and time. Vital signs are normal. He appears well-developed and well-nourished. He is active and cooperative. He does not appear ill. No distress.  HENT:  Head: Normocephalic and atraumatic.  Mouth/Throat: Uvula is midline, oropharynx is clear and moist and mucous membranes are normal. Mucous membranes are not pale, not dry and not cyanotic.  Eyes: Conjunctivae, EOM and lids are normal. Pupils are equal, round, and reactive to light.  Neck: Trachea normal, normal range of motion and full passive range of motion without pain. Neck supple. No JVD present. No tracheal deviation, no edema and no erythema present. No thyromegaly present.  Cardiovascular: Normal rate, normal heart sounds and intact distal pulses.  An irregular rhythm present. Exam reveals no gallop, no distant heart sounds and no friction rub.   No murmur heard. Pulses:      Dorsalis pedis pulses are 1+ on the right side, and 1+ on the left side.  Pulmonary/Chest: Effort normal and breath sounds normal. No accessory muscle usage. No respiratory distress. He has no decreased breath sounds. He has no wheezes. He has no rhonchi. He has no rales. He exhibits no tenderness.  Abdominal: Normal appearance and bowel sounds are normal. He exhibits no distension and no ascites. There is no tenderness.  Musculoskeletal: Normal range of motion. He exhibits no edema or tenderness.  Expected osteoarthritis, stiffness  Neurological: He is alert and oriented to person, place, and time. He has normal strength.  Skin: Skin is warm, dry and intact. He is not diaphoretic. No cyanosis. No pallor. Nails show no  clubbing.     Skin thick, dry and wrinkled  Psychiatric: He has a normal mood and affect. His speech is normal and behavior is normal. Judgment and thought content normal. Cognition and memory are normal.  Nursing note and vitals reviewed.   Labs reviewed:  Recent Labs  12/11/15 0734 12/12/15 0445 12/20/15 0706  NA 136 138 141  K 3.6 3.6 3.3*  CL 105 108 105  CO2 23 27 28   GLUCOSE 131* 151* 98  BUN 28* 31* 19  CREATININE 1.42* 1.35* 1.10  CALCIUM 8.4* 8.3* 8.7*    Recent Labs  12/10/15 1501 12/20/15 0706  AST 30 30  ALT 20 31  ALKPHOS 132* 159*  BILITOT 1.9* 0.6  PROT 7.5 7.1  ALBUMIN 4.1 3.2*    Recent Labs  12/10/15 1501 12/11/15 0734 12/20/15 0706  WBC 17.8* 14.4* 7.6  NEUTROABS 16.7*  --  4.8  HGB 14.2 13.0 14.6  HCT 42.5 36.9* 41.7  MCV 93.2 91.7 91.8  PLT 115* 96* 219   No results found for: TSH No results found for:  HGBA1C No results found for: CHOL, HDL, LDLCALC, LDLDIRECT, TRIG, CHOLHDL  Significant Diagnostic Results in last 30 days:  Dg Chest 2 View  Result Date: 12/10/2015 CLINICAL DATA:  Lower extremity swelling EXAM: CHEST  2 VIEW COMPARISON:  06/02/2015 chest radiograph. FINDINGS: Stable configuration of sternotomy wires and 3 lead right subclavian ICD. Stable cardiomediastinal silhouette with mild cardiomegaly and aortic atherosclerosis. No pneumothorax. Trace bilateral pleural effusions. Mild pulmonary edema. No acute consolidative airspace disease. IMPRESSION: 1. Mild congestive heart failure. 2. Trace bilateral pleural effusions. 3. No acute consolidative airspace disease. 4. Aortic atherosclerosis. Electronically Signed   By: Delbert Phenix M.D.   On: 12/10/2015 16:33   US Venous Img Lower Unilateral Right  Result Date: 12/10/2015 CLINICAL DATA:  Right lower extremity edema EXAM: RIGHT LOWER EXTREMITY VENOUS DUPLEX ULTRASOUND TECHNIQUE: Gray-scale sonography with graded compression, as well as color Doppler and duplex ultrasound were  performed to evaluate the right lower extremity deep venous system from the level of the common femoral vein and including the common femoral, femoral, profunda femoral, popliteal and calf veins including the posterior tibial, peroneal and gastrocnemius veins when visible. The superficial great saphenous vein was also interrogated. Spectral Doppler was utilized to evaluate flow at rest and with distal augmentation maneuvers in the common femoral, femoral and popliteal veins. COMPARISON:  June 03, 2015 FINDINGS: Contralateral Common Femoral Vein: Respiratory phasicity is normal and symmetric with the symptomatic side. No evidence of thrombus. Normal compressibility. Common Femoral Vein: No evidence of thrombus. Normal compressibility, respiratory phasicity and response to augmentation. Saphenofemoral Junction: No evidence of thrombus. Normal compressibility and flow on color Doppler imaging. Profunda Femoral Vein: No evidence of thrombus. Normal compressibility and flow on color Doppler imaging. Femoral Vein: No evidence of thrombus. Normal compressibility, respiratory phasicity and response to augmentation. Popliteal Vein: No evidence of thrombus. Normal compressibility, respiratory phasicity and response to augmentation. Calf Veins: No evidence of thrombus in the posterior tibial vein. Right peroneal vein not well seen. Normal compressibility and flow on color Doppler imaging in visualized calf venous structures. Superficial Great Saphenous Vein: No evidence of thrombus. Normal compressibility and flow on color Doppler imaging. Venous Reflux:  None. Other Findings:  Soft tissue edema right lower extremity IMPRESSION: No evidence of right lower extremity deep venous thrombosis. Note that the right peroneal vein is not well seen due to lower extremity edema. Left common femoral vein also patent. Electronically Signed   By: Bretta Bang III M.D.   On: 12/10/2015 17:09    Assessment/Plan 1. Encounter for  therapeutic drug monitoring  Coumadin 3.5 mg PO Q Day at 1700 for Atrial Fibrillation  Recheck INR 3 days  Monitor for s/s of bleeding, bruising, hematuria, hematemesis, melena  Check INR 3 days after initiation of antibiotic, when applicable.  2. Cellulitis of right lower extremity  Eucerin cream- apply liberal amount to BLE BID and prn. OK to leave at bedside.   Elevate legs when at rest  Antibiotic course completed   Family/ staff Communication:   Total Time:  Documentation:  Face to Face:  Family/Phone:   Labs/tests ordered:  INR, CBC  Medication list reviewed and assessed for continued appropriateness. Monthly medication orders reviewed and signed.  Brynda Rim, NP-C Geriatrics [facility] [phone], [place] [address] Cell Phone (Mon-Fri 8am-5pm):  [phone] On Call:  [phone] & follow prompts after 5pm & weekends Office Phone:  [phone] Office Fax:  [phone]

## 2016-03-15 ENCOUNTER — Encounter: Payer: Self-pay | Admitting: Emergency Medicine

## 2016-03-15 ENCOUNTER — Emergency Department: Payer: Medicare PPO

## 2016-03-15 ENCOUNTER — Inpatient Hospital Stay
Admission: EM | Admit: 2016-03-15 | Discharge: 2016-03-20 | DRG: 871 | Disposition: A | Discharge status: Skilled Nursing Facility | Payer: Medicare PPO | Attending: Internal Medicine | Admitting: Internal Medicine

## 2016-03-15 DIAGNOSIS — R410 Disorientation, unspecified: Secondary | ICD-10-CM | POA: Diagnosis present

## 2016-03-15 DIAGNOSIS — R262 Difficulty in walking, not elsewhere classified: Secondary | ICD-10-CM

## 2016-03-15 DIAGNOSIS — A419 Sepsis, unspecified organism: Secondary | ICD-10-CM | POA: Diagnosis present

## 2016-03-15 DIAGNOSIS — M6281 Muscle weakness (generalized): Secondary | ICD-10-CM

## 2016-03-15 DIAGNOSIS — Z951 Presence of aortocoronary bypass graft: Secondary | ICD-10-CM

## 2016-03-15 DIAGNOSIS — I482 Chronic atrial fibrillation: Secondary | ICD-10-CM | POA: Diagnosis present

## 2016-03-15 DIAGNOSIS — I255 Ischemic cardiomyopathy: Secondary | ICD-10-CM | POA: Diagnosis present

## 2016-03-15 DIAGNOSIS — E876 Hypokalemia: Secondary | ICD-10-CM | POA: Diagnosis present

## 2016-03-15 DIAGNOSIS — R509 Fever, unspecified: Secondary | ICD-10-CM | POA: Diagnosis present

## 2016-03-15 DIAGNOSIS — I5022 Chronic systolic (congestive) heart failure: Secondary | ICD-10-CM | POA: Diagnosis present

## 2016-03-15 DIAGNOSIS — N183 Chronic kidney disease, stage 3 (moderate): Secondary | ICD-10-CM | POA: Diagnosis present

## 2016-03-15 DIAGNOSIS — I248 Other forms of acute ischemic heart disease: Secondary | ICD-10-CM | POA: Diagnosis present

## 2016-03-15 DIAGNOSIS — Z79899 Other long term (current) drug therapy: Secondary | ICD-10-CM

## 2016-03-15 DIAGNOSIS — E785 Hyperlipidemia, unspecified: Secondary | ICD-10-CM | POA: Diagnosis present

## 2016-03-15 DIAGNOSIS — N3001 Acute cystitis with hematuria: Secondary | ICD-10-CM | POA: Diagnosis present

## 2016-03-15 DIAGNOSIS — Z7901 Long term (current) use of anticoagulants: Secondary | ICD-10-CM | POA: Diagnosis not present

## 2016-03-15 DIAGNOSIS — K219 Gastro-esophageal reflux disease without esophagitis: Secondary | ICD-10-CM | POA: Diagnosis present

## 2016-03-15 DIAGNOSIS — Z8249 Family history of ischemic heart disease and other diseases of the circulatory system: Secondary | ICD-10-CM | POA: Diagnosis not present

## 2016-03-15 DIAGNOSIS — I4891 Unspecified atrial fibrillation: Secondary | ICD-10-CM | POA: Diagnosis present

## 2016-03-15 DIAGNOSIS — I517 Cardiomegaly: Secondary | ICD-10-CM | POA: Diagnosis present

## 2016-03-15 DIAGNOSIS — Z95810 Presence of automatic (implantable) cardiac defibrillator: Secondary | ICD-10-CM

## 2016-03-15 DIAGNOSIS — J189 Pneumonia, unspecified organism: Secondary | ICD-10-CM | POA: Diagnosis present

## 2016-03-15 DIAGNOSIS — N390 Urinary tract infection, site not specified: Secondary | ICD-10-CM | POA: Diagnosis present

## 2016-03-15 DIAGNOSIS — R0602 Shortness of breath: Secondary | ICD-10-CM

## 2016-03-15 DIAGNOSIS — I480 Paroxysmal atrial fibrillation: Secondary | ICD-10-CM | POA: Diagnosis present

## 2016-03-15 DIAGNOSIS — Z888 Allergy status to other drugs, medicaments and biological substances status: Secondary | ICD-10-CM

## 2016-03-15 DIAGNOSIS — I2510 Atherosclerotic heart disease of native coronary artery without angina pectoris: Secondary | ICD-10-CM | POA: Diagnosis present

## 2016-03-15 DIAGNOSIS — Z885 Allergy status to narcotic agent status: Secondary | ICD-10-CM

## 2016-03-15 DIAGNOSIS — N3000 Acute cystitis without hematuria: Secondary | ICD-10-CM

## 2016-03-15 HISTORY — DX: Gastro-esophageal reflux disease without esophagitis: K21.9

## 2016-03-15 HISTORY — DX: Chronic kidney disease, unspecified: N18.9

## 2016-03-15 LAB — COMPREHENSIVE METABOLIC PANEL
ALBUMIN: 3.2 g/dL — AB (ref 3.5–5.0)
ALT: 22 U/L (ref 17–63)
ANION GAP: 8 (ref 5–15)
AST: 33 U/L (ref 15–41)
Alkaline Phosphatase: 175 U/L — ABNORMAL HIGH (ref 38–126)
BUN: 27 mg/dL — ABNORMAL HIGH (ref 6–20)
CO2: 22 mmol/L (ref 22–32)
Calcium: 8.3 mg/dL — ABNORMAL LOW (ref 8.9–10.3)
Chloride: 107 mmol/L (ref 101–111)
Creatinine, Ser: 1.32 mg/dL — ABNORMAL HIGH (ref 0.61–1.24)
GFR calc Af Amer: 52 mL/min — ABNORMAL LOW (ref >60)
GFR calc non Af Amer: 44 mL/min — ABNORMAL LOW (ref >60)
GLUCOSE: 135 mg/dL — AB (ref 65–99)
POTASSIUM: 3.4 mmol/L — AB (ref 3.5–5.1)
SODIUM: 137 mmol/L (ref 135–145)
TOTAL PROTEIN: 6.5 g/dL (ref 6.5–8.1)
Total Bilirubin: 1.3 mg/dL — ABNORMAL HIGH (ref 0.3–1.2)

## 2016-03-15 LAB — CBC WITH DIFFERENTIAL/PLATELET
BASOS PCT: 0 %
Basophils Absolute: 0 10*3/uL (ref 0–0.1)
Eosinophils Absolute: 0 10*3/uL (ref 0–0.7)
Eosinophils Relative: 0 %
HEMATOCRIT: 38.4 % — AB (ref 40.0–52.0)
HEMOGLOBIN: 12.8 g/dL — AB (ref 13.0–18.0)
LYMPHS ABS: 0.3 10*3/uL — AB (ref 1.0–3.6)
Lymphocytes Relative: 2 %
MCH: 31.1 pg (ref 26.0–34.0)
MCHC: 33.2 g/dL (ref 32.0–36.0)
MCV: 93.7 fL (ref 80.0–100.0)
MONOS PCT: 7 %
Monocytes Absolute: 1 10*3/uL (ref 0.2–1.0)
NEUTROS ABS: 12.6 10*3/uL — AB (ref 1.4–6.5)
NEUTROS PCT: 91 %
Platelets: 129 10*3/uL — ABNORMAL LOW (ref 150–440)
RBC: 4.1 MIL/uL — ABNORMAL LOW (ref 4.40–5.90)
RDW: 14.6 % — ABNORMAL HIGH (ref 11.5–14.5)
WBC: 13.9 10*3/uL — ABNORMAL HIGH (ref 3.8–10.6)

## 2016-03-15 LAB — URINALYSIS, COMPLETE (UACMP) WITH MICROSCOPIC
BILIRUBIN URINE: NEGATIVE
GLUCOSE, UA: NEGATIVE mg/dL
KETONES UR: NEGATIVE mg/dL
NITRITE: NEGATIVE
PH: 5 (ref 5.0–8.0)
Protein, ur: 100 mg/dL — AB
Specific Gravity, Urine: 1.012 (ref 1.005–1.030)

## 2016-03-15 LAB — PROTIME-INR
INR: 2.21
Prothrombin Time: 24.9 seconds — ABNORMAL HIGH (ref 11.4–15.2)

## 2016-03-15 LAB — INFLUENZA PANEL BY PCR (TYPE A & B)
Influenza A By PCR: NEGATIVE
Influenza B By PCR: NEGATIVE

## 2016-03-15 LAB — TROPONIN I: Troponin I: 0.39 ng/mL (ref <0.03)

## 2016-03-15 LAB — LACTIC ACID, PLASMA: LACTIC ACID, VENOUS: 1.2 mmol/L (ref 0.5–1.9)

## 2016-03-15 MED ORDER — METOPROLOL SUCCINATE ER 50 MG PO TB24
25.0000 mg | ORAL_TABLET | Freq: Every day | ORAL | Status: DC
  Administered 2016-03-16 – 2016-03-20 (×5): 25 mg via ORAL
  Filled 2016-03-15 (×5): qty 1

## 2016-03-15 MED ORDER — GUAIFENESIN-DM 100-10 MG/5ML PO SYRP: 5.0000 mL | ORAL_SOLUTION | ORAL | Status: DC | PRN

## 2016-03-15 MED ORDER — ASPIRIN 81 MG PO CHEW
324.0000 mg | CHEWABLE_TABLET | Freq: Once | ORAL | Status: AC
  Administered 2016-03-15: 324 mg via ORAL
  Filled 2016-03-15: qty 4

## 2016-03-15 MED ORDER — VANCOMYCIN HCL IN DEXTROSE 1-5 GM/200ML-% IV SOLN
1000.0000 mg | Freq: Once | INTRAVENOUS | Status: AC
  Administered 2016-03-15: 1000 mg via INTRAVENOUS
  Filled 2016-03-15: qty 200

## 2016-03-15 MED ORDER — ACETAMINOPHEN 650 MG RE SUPP: 650.0000 mg | Freq: Four times a day (QID) | RECTAL | Status: DC | PRN

## 2016-03-15 MED ORDER — SODIUM CHLORIDE 0.9 % IV SOLN
Freq: Once | INTRAVENOUS | Status: AC
  Administered 2016-03-15: 1000 mL via INTRAVENOUS

## 2016-03-15 MED ORDER — IPRATROPIUM-ALBUTEROL 0.5-2.5 (3) MG/3ML IN SOLN: 3.0000 mL | RESPIRATORY_TRACT | Status: DC | PRN

## 2016-03-15 MED ORDER — FUROSEMIDE 40 MG PO TABS
40.0000 mg | ORAL_TABLET | Freq: Two times a day (BID) | ORAL | Status: DC
  Administered 2016-03-16 – 2016-03-20 (×10): 40 mg via ORAL
  Filled 2016-03-15 (×10): qty 1

## 2016-03-15 MED ORDER — ACETAMINOPHEN 325 MG PO TABS
650.0000 mg | ORAL_TABLET | Freq: Four times a day (QID) | ORAL | Status: DC | PRN
  Administered 2016-03-16 – 2016-03-18 (×2): 650 mg via ORAL
  Filled 2016-03-15 (×2): qty 2

## 2016-03-15 MED ORDER — PIPERACILLIN-TAZOBACTAM 3.375 G IVPB 30 MIN
3.3750 g | Freq: Once | INTRAVENOUS | Status: AC
  Administered 2016-03-15: 3.375 g via INTRAVENOUS
  Filled 2016-03-15: qty 50

## 2016-03-15 MED ORDER — BENZONATATE 100 MG PO CAPS: 200.0000 mg | ORAL_CAPSULE | Freq: Three times a day (TID) | ORAL | Status: DC | PRN

## 2016-03-15 MED ORDER — ONDANSETRON HCL 4 MG PO TABS: 4.0000 mg | ORAL_TABLET | Freq: Four times a day (QID) | ORAL | Status: DC | PRN

## 2016-03-15 MED ORDER — ONDANSETRON HCL 4 MG/2ML IJ SOLN: 4.0000 mg | Freq: Four times a day (QID) | INTRAMUSCULAR | Status: DC | PRN

## 2016-03-15 MED ORDER — SIMVASTATIN 40 MG PO TABS
40.0000 mg | ORAL_TABLET | Freq: Every day | ORAL | Status: DC
  Administered 2016-03-16 – 2016-03-19 (×4): 40 mg via ORAL
  Filled 2016-03-15 (×4): qty 1

## 2016-03-15 NOTE — ED Notes (Signed)
ED Provider at bedside. 

## 2016-03-15 NOTE — ED Provider Notes (Addendum)
[facility] Emergency Department Provider Note   L5 caveat: Review of systems and history is limited by altered mental status     Time seen: ----------------------------------------- 7:48 PM on 03/15/2016 -----------------------------------------    I have reviewed the triage vital signs and the nursing notes.   HISTORY  Chief Complaint Fever    HPI Vincent [name] D Zolman is a 82 y.o. male who presents to the ER being brought by EMS from home for fever and possible altered mental status. Reported strong smell of urine noted to his home. He was sitting with his pants pulled down in a chair and was somewhat confused. He denies any specific complaints.   Past Medical History:  Diagnosis Date  . AICD (automatic cardioverter/defibrillator) present   . Atrial fibrillation (HCC)   . CHF (congestive heart failure) (HCC)   . Coronary artery disease   . Presence of permanent cardiac pacemaker     Patient Active Problem List   Diagnosis Date Noted  . Cellulitis 12/10/2015    No past surgical history on file.  Allergies Atorvastatin; Codeine; and Meperidine  Social History Social History  Substance Use Topics  . Smoking status: Never Smoker  . Smokeless tobacco: Never Used  . Alcohol use No    Review of Systems Unknown, positive for reported fever and confusion  ____________________________________________   PHYSICAL EXAM:  VITAL SIGNS: ED Triage Vitals  Enc Vitals Group     BP      Pulse      Resp      Temp      Temp src      SpO2      Weight      Height      Head Circumference      Peak Flow      Pain Score      Pain Loc      Pain Edu?      Excl. in GC?     Constitutional: Alert But disoriented. Hard of hearing, no distress Eyes: Conjunctivae are normal. PERRL. Normal extraocular movements. ENT   Head: Normocephalic and atraumatic.   Nose: No congestion/rhinnorhea.   Mouth/Throat: Mucous membranes are moist.    Neck: No stridor. Cardiovascular: Normal rate, regular rhythm. No murmurs, rubs, or gallops. Respiratory: Normal respiratory effort without tachypnea nor retractions. Rhonchi noted on the right Gastrointestinal: Soft and nontender. Normal bowel sounds Musculoskeletal: Nontender with normal range of motion in all extremities. No lower extremity tenderness nor edema. Neurologic:  Normal speech and language. No gross focal neurologic deficits are appreciated.  Skin:  Skin is warm, dry and intact. No rash noted. Psychiatric: Mood and affect are normal. Speech and behavior are normal.  ____________________________________________  EKG: Interpreted by me. Ventricular paced rhythm with a rate of 84 bpm. No other acute changes are identified.  ____________________________________________  ED COURSE:  Pertinent labs & imaging results that were available during my care of the patient were reviewed by me and considered in my medical decision making (see chart for details). Clinical Course   Patient is in no acute distress, we will assess with labs and imaging. We will initiate sepsis protocol.  Procedures ____________________________________________   LABS (pertinent positives/negatives)  Labs Reviewed  COMPREHENSIVE METABOLIC PANEL - Abnormal; Notable for the following:       Result Value   Potassium 3.4 (*)    Glucose, Bld 135 (*)    BUN 27 (*)    Creatinine, Ser 1.32 (*)    Calcium  8.3 (*)    Albumin 3.2 (*)    Alkaline Phosphatase 175 (*)    Total Bilirubin 1.3 (*)    GFR calc non Af Amer 44 (*)    GFR calc Af Amer 52 (*)    All other components within normal limits  TROPONIN I - Abnormal; Notable for the following:    Troponin I 0.39 (*)    All other components within normal limits  CBC WITH DIFFERENTIAL/PLATELET - Abnormal; Notable for the following:    WBC 13.9 (*)    RBC 4.10 (*)    Hemoglobin 12.8 (*)    HCT 38.4 (*)    RDW 14.6 (*)    Platelets 129 (*)    Neutro Abs  12.6 (*)    Lymphs Abs 0.3 (*)    All other components within normal limits  BLOOD GAS, VENOUS - Abnormal; Notable for the following:    pCO2, Ven 33 (*)    pO2, Ven 50.0 (*)    All other components within normal limits  URINALYSIS, COMPLETE (UACMP) WITH MICROSCOPIC - Abnormal; Notable for the following:    Color, Urine YELLOW (*)    APPearance CLOUDY (*)    Hgb urine dipstick LARGE (*)    Protein, ur 100 (*)    Leukocytes, UA LARGE (*)    Bacteria, UA MANY (*)    Squamous Epithelial / LPF 0-5 (*)    All other components within normal limits  CULTURE, BLOOD (ROUTINE X 2)  CULTURE, BLOOD (ROUTINE X 2)  URINE CULTURE  LACTIC ACID, PLASMA  LACTIC ACID, PLASMA  INFLUENZA PANEL BY PCR (TYPE A & B, H1N1)  PROTIME-INR  CRITICAL CARE Performed by: Emily FilbertWilliams, Jolonda Gomm E   Total critical care time: 30 minutes  Critical care time was exclusive of separately billable procedures and treating other patients.  Critical care was necessary to treat or prevent imminent or life-threatening deterioration.  Critical care was time spent personally by me on the following activities: development of treatment plan with patient and/or surrogate as well as nursing, discussions with consultants, evaluation of patient's response to treatment, examination of patient, obtaining history from patient or surrogate, ordering and performing treatments and interventions, ordering and review of laboratory studies, ordering and review of radiographic studies, pulse oximetry and re-evaluation of patient's condition.   RADIOLOGY Images were viewed by me  Chest x-ray  IMPRESSION: Stable mild cardiomegaly. Patchy bibasilar lung opacities, suspicious for pneumonia or aspiration, with a component of mild pulmonary edema not excluded. Recommend attention on follow up chest imaging.  Small bilateral pleural effusions.  Aortic atherosclerosis. ____________________________________________  FINAL ASSESSMENT AND  PLAN  SIRS, Pneumonia, UTI  Plan: Patient with labs and imaging as dictated above. Patient presented to the ER with fever and weakness. He is found to likely have pneumonia and urinary tract infection. We have started broad-spectrum antibiotic coverage. To this point lactic acid levels have been normal. I will discuss with the hospitalist for admission.   Emily FilbertWilliams, Cranston Koors E, MD   Note: This dictation was prepared with Dragon dictation. Any transcriptional errors that result from this process are unintentional    Emily FilbertJonathan E Mearl Harewood, MD 03/15/16 2158    Emily FilbertJonathan E Kimsey Demaree, MD 03/15/16 2204    Emily FilbertJonathan E Loise Esguerra, MD 03/15/16 2219    Emily FilbertJonathan E Elin Fenley, MD 03/15/16 2219

## 2016-03-15 NOTE — ED Notes (Signed)
XR at bedside

## 2016-03-15 NOTE — ED Notes (Signed)
Lab called with abnormal Troponin of 0.39, MD notified.

## 2016-03-15 NOTE — ED Triage Notes (Signed)
Per EMS, patient's family called them out bc patient had strong smell of urine and temp of 101F.  He was given 500mg  of Tylenol by daughter about an hour ago.  Patient is alert and oriented x4 but has hx of dementia.  Pt has hx of hypertension and hyperlipidemia.  He taked coumadin and lasix.

## 2016-03-15 NOTE — H&P (Addendum)
[facility] [place] [facility]   PATIENT NAME: Vincent [name] Mazo    MR#:  [id number]  DATE OF BIRTH:  Feb 14, 1920  DATE OF ADMISSION:  03/15/2016  PRIMARY CARE PHYSICIAN: Danella PentonMark F Miller, MD   REQUESTING/REFERRING PHYSICIAN: Mayford KnifeWilliams, MD  CHIEF COMPLAINT:   Chief Complaint  Patient presents with  . Fever    HISTORY OF PRESENT ILLNESS:  Vincent [name] Dayley  is a 82 y.o. male who presents with Fever, malaise, shortness of breath for the past 3-4 days. Patient was febrile and neurology. The ED, had an elevated white blood cell count, and chest x-ray consistent with pneumonia. Hospitalists were called for admission.  PAST MEDICAL HISTORY:   Past Medical History:  Diagnosis Date  . AICD (automatic cardioverter/defibrillator) present   . Atrial fibrillation (HCC)   . CHF (congestive heart failure) (HCC)   . CKD (chronic kidney disease)   . Coronary artery disease   . GERD (gastroesophageal reflux disease)   . Presence of permanent cardiac pacemaker     PAST SURGICAL HISTORY:   Past Surgical History:  Procedure Laterality Date  . CATARACT EXTRACTION    . CORONARY ARTERY BYPASS GRAFT    . HERNIA REPAIR      SOCIAL HISTORY:   Social History  Substance Use Topics  . Smoking status: Never Smoker  . Smokeless tobacco: Never Used  . Alcohol use No    FAMILY HISTORY:   Family History  Problem Relation Age of Onset  . Heart attack Mother   . Heart disease Neg Hx   . Diabetes Neg Hx     DRUG ALLERGIES:   Allergies  Allergen Reactions  . Atorvastatin     Other reaction(s): Unknown  . Codeine Nausea Only  . Meperidine Nausea Only    MEDICATIONS AT HOME:   Prior to Admission medications   Medication Sig Start Date End Date Taking? Authorizing Provider  furosemide (LASIX) 40 MG tablet Take 1 tablet by mouth 2 (two) times daily. 04/16/15  Yes Historical Provider, MD  metoprolol succinate (TOPROL-XL) 25 MG 24 hr tablet Take 1 tablet by mouth  daily. 05/13/15  Yes Historical Provider, MD  simvastatin (ZOCOR) 40 MG tablet Take 1 tablet by mouth daily at 6 PM.  05/10/15  Yes Historical Provider, MD  warfarin (COUMADIN) 4 MG tablet Take 1 tablet by mouth daily at 6 PM.  05/16/15  Yes Historical Provider, MD  docusate sodium (COLACE) 100 MG capsule Take 1 capsule (100 mg total) by mouth 2 (two) times daily. Patient not taking: Reported on 03/15/2016 12/13/15   Wyatt Hasteavid K Hower, MD  polyethylene glycol [facility](MIRALAX / Ethelene HalGLYCOLAX) packet Take 17 g by mouth daily as needed for mild constipation or moderate constipation. Patient not taking: Reported on 03/15/2016 12/13/15   Wyatt Hasteavid K Hower, MD    REVIEW OF SYSTEMS:  Review of Systems  Constitutional: Positive for fever. Negative for chills, malaise/fatigue and weight loss.  HENT: Negative for ear pain, hearing loss and tinnitus.   Eyes: Negative for blurred vision, double vision, pain and redness.  Respiratory: Positive for cough and shortness of breath. Negative for hemoptysis.   Cardiovascular: Negative for chest pain, palpitations, orthopnea and leg swelling.  Gastrointestinal: Negative for abdominal pain, constipation, diarrhea, nausea and vomiting.  Genitourinary: Negative for dysuria, frequency and hematuria.  Musculoskeletal: Negative for back pain, joint pain and neck pain.  Skin:       No acne, rash, or lesions  Neurological: Negative for dizziness, tremors, focal  weakness and weakness.  Endo/Heme/Allergies: Negative for polydipsia. Does not bruise/bleed easily.  Psychiatric/Behavioral: Negative for depression. The patient is not nervous/anxious and does not have insomnia.      VITAL SIGNS:   Vitals:   03/15/16 1952 03/15/16 1953 03/15/16 2000 03/15/16 2130  BP: 109/65  (!) 108/57 118/65  Pulse: 86  87 75  Resp: 16   (!) 22  Temp: (!) 100.5 F (38.1 C)     TempSrc: Oral     SpO2: 94%  93% 93%  Weight:  93 kg (205 lb)    Height:  5\' 7"  (1.702 m)     Wt Readings from Last 3  Encounters:  03/15/16 93 kg (205 lb)  12/19/15 93.5 kg (206 lb 1.6 oz)  12/10/15 98 kg (216 lb)    PHYSICAL EXAMINATION:  Physical Exam  Vitals reviewed. Constitutional: He is oriented to person, place, and time. He appears well-developed and well-nourished. No distress.  HENT:  Head: Normocephalic and atraumatic.  Mouth/Throat: Oropharynx is clear and moist.  Eyes: Conjunctivae and EOM are normal. Pupils are equal, round, and reactive to light. No scleral icterus.  Neck: Normal range of motion. Neck supple. No JVD present. No thyromegaly present.  Cardiovascular: Normal rate, regular rhythm and intact distal pulses.  Exam reveals no gallop and no friction rub.   No murmur heard. Respiratory: Effort normal and breath sounds normal. No respiratory distress. He has no wheezes. He has no rales.  Right greater than left scattered rhonchi  GI: Soft. Bowel sounds are normal. He exhibits no distension. There is no tenderness.  Musculoskeletal: Normal range of motion. He exhibits no edema.  No arthritis, no gout  Lymphadenopathy:    He has no cervical adenopathy.  Neurological: He is alert and oriented to person, place, and time. No cranial nerve deficit.  No dysarthria, no aphasia  Skin: Skin is warm and dry. No rash noted. No erythema.  Psychiatric: He has a normal mood and affect. His behavior is normal. Judgment and thought content normal.    LABORATORY PANEL:   CBC  Recent Labs Lab 03/15/16 2003  WBC 13.9*  HGB 12.8*  HCT 38.4*  PLT 129*   ------------------------------------------------------------------------------------------------------------------  Chemistries   Recent Labs Lab 03/15/16 2003  NA 137  K 3.4*  CL 107  CO2 22  GLUCOSE 135*  BUN 27*  CREATININE 1.32*  CALCIUM 8.3*  AST 33  ALT 22  ALKPHOS 175*  BILITOT 1.3*   ------------------------------------------------------------------------------------------------------------------  Cardiac  Enzymes  Recent Labs Lab 03/15/16 2003  TROPONINI 0.39*   ------------------------------------------------------------------------------------------------------------------  RADIOLOGY:  Dg Chest Port 1 View  Result Date: 03/15/2016 CLINICAL DATA:  Fever EXAM: PORTABLE CHEST 1 VIEW COMPARISON:  12/10/2015 chest radiograph. FINDINGS: Stable configuration of median sternotomy wires, CABG clips and 3 lead right subclavian ICD. Stable cardiomediastinal silhouette with mild cardiomegaly and aortic atherosclerosis. No pneumothorax. Small bilateral pleural effusions. Stable biapical pleural thickening. Patchy bibasilar lung opacities. IMPRESSION: Stable mild cardiomegaly. Patchy bibasilar lung opacities, suspicious for pneumonia or aspiration, with a component of mild pulmonary edema not excluded. Recommend attention on follow up chest imaging. Small bilateral pleural effusions. Aortic atherosclerosis. Electronically Signed   By: Delbert PhenixJason A Poff M.D.   On: 03/15/2016 20:20    EKG:   Orders placed or performed during the hospital encounter of 03/15/16  . ED EKG 12-Lead  . ED EKG 12-Lead    IMPRESSION AND PLAN:  Principal Problem:   Sepsis (HCC) - due to pneumonia, IV antibiotics  started in the ED, lactic acid was within normal limits, patient is hemodynamically stable, culture sent from the ED Active Problems:   CAP (community acquired pneumonia) - IV antibiotics and cultures as above   UTI - UA suggestive of same with TNTC WBC and some RBC as well.  IV antibiotics and culture sent as above   Chronic systolic CHF (congestive heart failure) (HCC) - not an exacerbation, continue home meds   Atrial fibrillation (HCC) - continue home rate controlling medications as well as anticoagulation   CAD (coronary artery disease) - continue home meds  All the records are reviewed and case discussed with ED provider. Management plans discussed with the patient and/or family.  DVT PROPHYLAXIS: Systemic  anticoagulation  GI PROPHYLAXIS: None  ADMISSION STATUS: Inpatient  CODE STATUS: Full Code Status History    Date Active Date Inactive Code Status Order ID Comments User Context   12/10/2015  8:05 PM 12/13/2015 11:04 AM Full Code [id number]  Houston Siren, MD Inpatient   06/03/2015  4:38 AM 06/03/2015  3:03 PM Full Code [id number]  Enedina Finner, MD Inpatient      TOTAL TIME TAKING CARE OF THIS PATIENT: 45 minutes.    Chip Canepa FIELDING 03/15/2016, 10:25 PM  [facility]  Office  [phone]  CC: Primary care physician; Danella Penton, MD

## 2016-03-16 DIAGNOSIS — N390 Urinary tract infection, site not specified: Secondary | ICD-10-CM | POA: Diagnosis present

## 2016-03-16 LAB — CBC
HCT: 35.5 % — ABNORMAL LOW (ref 40.0–52.0)
HEMOGLOBIN: 12.2 g/dL — AB (ref 13.0–18.0)
MCH: 32.2 pg (ref 26.0–34.0)
MCHC: 34.4 g/dL (ref 32.0–36.0)
MCV: 93.5 fL (ref 80.0–100.0)
Platelets: 116 10*3/uL — ABNORMAL LOW (ref 150–440)
RBC: 3.79 MIL/uL — AB (ref 4.40–5.90)
RDW: 14.7 % — ABNORMAL HIGH (ref 11.5–14.5)
WBC: 11 10*3/uL — ABNORMAL HIGH (ref 3.8–10.6)

## 2016-03-16 LAB — BASIC METABOLIC PANEL
Anion gap: 8 (ref 5–15)
BUN: 28 mg/dL — AB (ref 6–20)
CO2: 22 mmol/L (ref 22–32)
CREATININE: 1.27 mg/dL — AB (ref 0.61–1.24)
Calcium: 8.2 mg/dL — ABNORMAL LOW (ref 8.9–10.3)
Chloride: 109 mmol/L (ref 101–111)
GFR calc Af Amer: 54 mL/min — ABNORMAL LOW (ref >60)
GFR, EST NON AFRICAN AMERICAN: 47 mL/min — AB (ref >60)
GLUCOSE: 115 mg/dL — AB (ref 65–99)
Potassium: 3.5 mmol/L (ref 3.5–5.1)
SODIUM: 139 mmol/L (ref 135–145)

## 2016-03-16 LAB — TROPONIN I
TROPONIN I: 1.05 ng/mL — AB (ref <0.03)
TROPONIN I: 1.05 ng/mL — AB (ref <0.03)
Troponin I: 0.94 ng/mL (ref <0.03)

## 2016-03-16 LAB — MRSA PCR SCREENING: MRSA BY PCR: NEGATIVE

## 2016-03-16 LAB — PROCALCITONIN: PROCALCITONIN: 24.8 ng/mL

## 2016-03-16 MED ORDER — DEXTROSE 5 % IV SOLN: 2.0000 g | INTRAVENOUS | Status: DC

## 2016-03-16 MED ORDER — DOXYCYCLINE HYCLATE 100 MG PO TABS: 100.0000 mg | ORAL_TABLET | Freq: Two times a day (BID) | ORAL | Status: DC

## 2016-03-16 MED ORDER — AZITHROMYCIN 250 MG PO TABS
250.0000 mg | ORAL_TABLET | ORAL | Status: DC
Start: 2016-03-17 — End: 2016-03-20
  Administered 2016-03-17 – 2016-03-19 (×3): 250 mg via ORAL
  Filled 2016-03-16 (×3): qty 1

## 2016-03-16 MED ORDER — VANCOMYCIN HCL 10 G IV SOLR
1250.0000 mg | INTRAVENOUS | Status: DC
  Administered 2016-03-16: 1250 mg via INTRAVENOUS
  Filled 2016-03-16: qty 1250

## 2016-03-16 MED ORDER — AZITHROMYCIN 500 MG PO TABS
500.0000 mg | ORAL_TABLET | Freq: Every day | ORAL | Status: AC
  Administered 2016-03-16: 500 mg via ORAL
  Filled 2016-03-16: qty 1

## 2016-03-16 MED ORDER — PIPERACILLIN-TAZOBACTAM 3.375 G IVPB
3.3750 g | Freq: Three times a day (TID) | INTRAVENOUS | Status: DC
  Administered 2016-03-16 (×2): 3.375 g via INTRAVENOUS
  Filled 2016-03-16 (×2): qty 50

## 2016-03-16 MED ORDER — WARFARIN SODIUM 4 MG PO TABS
4.0000 mg | ORAL_TABLET | Freq: Every day | ORAL | Status: DC
  Administered 2016-03-16 – 2016-03-18 (×3): 4 mg via ORAL
  Filled 2016-03-16 (×3): qty 1

## 2016-03-16 MED ORDER — CEFTRIAXONE SODIUM-DEXTROSE 2-2.22 GM-% IV SOLR
2.0000 g | INTRAVENOUS | Status: DC
  Administered 2016-03-16 – 2016-03-18 (×3): 2 g via INTRAVENOUS
  Filled 2016-03-16 (×4): qty 50

## 2016-03-16 NOTE — Consult Note (Signed)
[facility]  CARDIOLOGY CONSULT NOTE  Patient ID: Vincent [name] MRN: [id number] DOB/AGE: Mar 03, 1920 82 y.o.  Admit date: 03/15/2016 Referring Physician Dr. Renae GlossWieting  Primary Physician   Primary Cardiologist Dr. Darrold JunkerParaschos Reason for Consultation abnormal troponin  HPI: Pt is a 82 yo male with history of cad s/p cabg with a lima to lad, svg to om1 and svg to pda in 2004, , history of ischemic cardiomyopathy with known ef of 10% s/p biv aicd placement in 2011, history of paroxysmal afib treated with warfarin for anticoagulation and metoprolol for rate control and simvastatin for hyperlipidemia who was admitted with progressive dyspnea on exertional and resting sob on occasion. CXR revealed stable mild cardiomegaly with patchy bibasilar  Lung opacities s/p pneumonia or aspiration. EKG revealed v paced rhythm.  He had serum troponin drawn which revealed mildly elevated level peaking at 1.05. He denies chest pain and is hemodynamically stable. His inr is therapeutic at 2.21. He has no bleeding .   Review of Systems  Constitutional: Positive for malaise/fatigue.  HENT: Negative.   Eyes: Negative.   Respiratory: Positive for sputum production, shortness of breath and wheezing.   Cardiovascular: Negative for chest pain.  Gastrointestinal: Negative.   Genitourinary: Negative.   Musculoskeletal: Negative.   Skin: Negative.   Neurological: Positive for weakness.  Endo/Heme/Allergies: Negative.   Psychiatric/Behavioral: Negative.     Past Medical History:  Diagnosis Date  . AICD (automatic cardioverter/defibrillator) present   . Atrial fibrillation (HCC)   . CHF (congestive heart failure) (HCC)   . CKD (chronic kidney disease)   . Coronary artery disease   . GERD (gastroesophageal reflux disease)   . Presence of permanent cardiac pacemaker     Family History  Problem Relation Age of Onset  . Heart attack Mother   . Heart disease Neg Hx   .  Diabetes Neg Hx     Social History   Social History  . Marital status: Unknown    Spouse name: N/A  . Number of children: N/A  . Years of education: N/A   Occupational History  . Not on file.   Social History Main Topics  . Smoking status: Never Smoker  . Smokeless tobacco: Never Used  . Alcohol use No  . Drug use: No  . Sexual activity: Not on file   Other Topics Concern  . Not on file   Social History Narrative  . No narrative on file    Past Surgical History:  Procedure Laterality Date  . CATARACT EXTRACTION    . CORONARY ARTERY BYPASS GRAFT    . HERNIA REPAIR       Prescriptions Prior to Admission  Medication Sig Dispense Refill Last Dose  . furosemide (LASIX) 40 MG tablet Take 1 tablet by mouth 2 (two) times daily.   03/15/2016 at Unknown time  . metoprolol succinate (TOPROL-XL) 25 MG 24 hr tablet Take 1 tablet by mouth daily.   03/15/2016 at Unknown time  . simvastatin (ZOCOR) 40 MG tablet Take 1 tablet by mouth daily at 6 PM.    03/15/2016 at Unknown time  . warfarin (COUMADIN) 4 MG tablet Take 1 tablet by mouth daily at 6 PM.    03/15/2016 at Unknown time  . docusate sodium (COLACE) 100 MG capsule Take 1 capsule (100 mg total) by mouth 2 (two) times daily. (Patient not taking: Reported on 03/15/2016) 10 capsule 0 Not Taking at Unknown time  . polyethylene glycol (MIRALAX / GLYCOLAX)  packet Take 17 g by mouth daily as needed for mild constipation or moderate constipation. (Patient not taking: Reported on 03/15/2016) 14 each 0 Not Taking at Unknown time    Physical Exam: Blood pressure (!) 143/66, pulse 77, temperature 98.8 F (37.1 C), temperature source Oral, resp. rate 18, height 5\' 7"  (1.702 m), weight 209 lb 7 oz (95 kg), SpO2 100 %.   Wt Readings from Last 1 Encounters:  03/16/16 209 lb 7 oz (95 kg)     General appearance: alert and cooperative Neck: no adenopathy, no carotid bruit, no JVD, supple, symmetrical, trachea midline and thyroid not enlarged,  symmetric, no tenderness/mass/nodules Resp: diminished breath sounds bibasilar Cardio: irregularly irregular rhythm GI: soft, non-tender; bowel sounds normal; no masses,  no organomegaly Extremities: extremities normal, atraumatic, no cyanosis or edema Neurologic: Grossly normal  Labs:   Lab Results  Component Value Date   WBC 11.0 (H) 03/16/2016   HGB 12.2 (L) 03/16/2016   HCT 35.5 (L) 03/16/2016   MCV 93.5 03/16/2016   PLT 116 (L) 03/16/2016    Recent Labs Lab 03/15/16 2003 03/16/16 0627  NA 137 139  K 3.4* 3.5  CL 107 109  CO2 22 22  BUN 27* 28*  CREATININE 1.32* 1.27*  CALCIUM 8.3* 8.2*  PROT 6.5  --   BILITOT 1.3*  --   ALKPHOS 175*  --   ALT 22  --   AST 33  --   GLUCOSE 135* 115*   Lab Results  Component Value Date   TROPONINI 0.94 (HH) 03/16/2016      Radiology: mild bilateral opacities s/p pna vs aspiration.  EKG: v paced rhythm  ASSESSMENT AND PLAN:  82 yo male with history of cad s/p cabg with a lima to lad, svg to om1 and svg to pda in 2004, , history of ischemic cardiomyopathy with known ef of 10% s/p biv aicd placement in 2011, history of paroxysmal afib treated with warfarin for anticoagulation and metoprolol for rate control and simvastatin for hyperlipidemia who was admitted with progressive dyspnea on exertional and resting sob on occasion. He had a mild serum troponin elevation. He is therapeutic on his inr. Elevated troponin is likely secondary to demand. Not a candidate for invasive evaluation due to comorbid illness,  Age and probable non acs event. Continue to treat with warfarin and metoprolol. INR goal of 2-3. Continue to treat probable airspace idsease. Will follow with you.  Signed: Dalia HeadingKenneth A Jacson Rapaport MD, [facility] 03/16/2016, 1:58 PM

## 2016-03-16 NOTE — Progress Notes (Addendum)
Patient ID: Vincent [name], male   DOB: 1920/03/14, 82 y.o.   MRN: [id number]  [facility] PROGRESS NOTE  EURA RADABAUGH WUJ:[id number] DOB: [birth year] DOA: 03/15/2016 PCP: Danella Penton, MD  HPI/Subjective: Patient came in feeling really wiped out. He did have some shortness of breath. Feels weak.  Objective: Vitals:   03/16/16 0844 03/16/16 1226  BP: (!) 132/59 (!) 143/66  Pulse: 73 77  Resp: 18   Temp: 98.8 F (37.1 C)     Filed Weights   03/15/16 1953 03/15/16 2348 03/16/16 0402  Weight: 93 kg (205 lb) 95 kg (209 lb 8 oz) 95 kg (209 lb 7 oz)    ROS: Review of Systems  Constitutional: Negative for chills and fever.  Eyes: Negative for blurred vision.  Respiratory: Positive for cough and shortness of breath.   Cardiovascular: Negative for chest pain.  Gastrointestinal: Negative for abdominal pain, constipation, diarrhea, nausea and vomiting.  Genitourinary: Negative for dysuria.  Musculoskeletal: Negative for joint pain.  Neurological: Negative for dizziness and headaches.   Exam: Physical Exam  Constitutional: He is oriented to person, place, and time.  HENT:  Nose: No mucosal edema.  Mouth/Throat: No oropharyngeal exudate or posterior oropharyngeal edema.  Eyes: Conjunctivae, EOM and lids are normal. Pupils are equal, round, and reactive to light.  Neck: No JVD present. Carotid bruit is not present. No edema present. No thyroid mass and no thyromegaly present.  Cardiovascular: S1 normal and S2 normal.  An irregularly irregular rhythm present. Exam reveals no gallop.   Murmur heard.  Systolic murmur is present with a grade of 2/6  Pulses:      Dorsalis pedis pulses are 2+ on the right side, and 2+ on the left side.  Respiratory: No respiratory distress. He has decreased breath sounds in the right middle field, the right lower field and the left middle field. He has no wheezes. He has rhonchi in the right lower field and the left lower field. He has no rales.   GI: Soft. Bowel sounds are normal. There is no tenderness.  Musculoskeletal:       Right ankle: He exhibits swelling.       Left ankle: He exhibits swelling.  Lymphadenopathy:    He has no cervical adenopathy.  Neurological: He is alert and oriented to person, place, and time. No cranial nerve deficit.  Skin: Skin is warm. No rash noted. Nails show no clubbing.  Psychiatric: He has a normal mood and affect.      Data Reviewed: Basic Metabolic Panel:  Recent Labs Lab 03/15/16 2003 03/16/16 0627  NA 137 139  K 3.4* 3.5  CL 107 109  CO2 22 22  GLUCOSE 135* 115*  BUN 27* 28*  CREATININE 1.32* 1.27*  CALCIUM 8.3* 8.2*   Liver Function Tests:  Recent Labs Lab 03/15/16 2003  AST 33  ALT 22  ALKPHOS 175*  BILITOT 1.3*  PROT 6.5  ALBUMIN 3.2*   CBC:  Recent Labs Lab 03/15/16 2003 03/16/16 0627  WBC 13.9* 11.0*  NEUTROABS 12.6*  --   HGB 12.8* 12.2*  HCT 38.4* 35.5*  MCV 93.7 93.5  PLT 129* 116*   Cardiac Enzymes:  Recent Labs Lab 03/15/16 2003 03/16/16 0021 03/16/16 0627 03/16/16 0939  TROPONINI 0.39* 1.05* 1.05* 0.94*     Recent Results (from the past 240 hour(s))  Blood Culture (routine x 2)     Status: None (Preliminary result)   Collection Time: 03/15/16  8:03 PM  Result  Value Ref Range Status   Specimen Description BLOOD  R WRIST  Final   Special Requests BOTTLES DRAWN AEROBIC AND ANAEROBIC ANA9 AER 13ML  Final   Culture NO GROWTH < 12 HOURS  Final   Report Status PENDING  Incomplete  Blood Culture (routine x 2)     Status: None (Preliminary result)   Collection Time: 03/15/16  8:22 PM  Result Value Ref Range Status   Specimen Description BLOOD RIGHT AC  Final   Special Requests   Final    BOTTLES DRAWN AEROBIC AND ANAEROBIC AER 10ML ANA 9ML   Culture NO GROWTH < 12 HOURS  Final   Report Status PENDING  Incomplete  MRSA PCR Screening     Status: None   Collection Time: 03/16/16  9:07 AM  Result Value Ref Range Status   MRSA by PCR  NEGATIVE NEGATIVE Final    Comment:        The GeneXpert MRSA Assay (FDA approved for NASAL specimens only), is one component of a comprehensive MRSA colonization surveillance program. It is not intended to diagnose MRSA infection nor to guide or monitor treatment for MRSA infections.      Studies: Dg Chest Port 1 View  Result Date: 03/15/2016 CLINICAL DATA:  Fever EXAM: PORTABLE CHEST 1 VIEW COMPARISON:  12/10/2015 chest radiograph. FINDINGS: Stable configuration of median sternotomy wires, CABG clips and 3 lead right subclavian ICD. Stable cardiomediastinal silhouette with mild cardiomegaly and aortic atherosclerosis. No pneumothorax. Small bilateral pleural effusions. Stable biapical pleural thickening. Patchy bibasilar lung opacities. IMPRESSION: Stable mild cardiomegaly. Patchy bibasilar lung opacities, suspicious for pneumonia or aspiration, with a component of mild pulmonary edema not excluded. Recommend attention on follow up chest imaging. Small bilateral pleural effusions. Aortic atherosclerosis. Electronically Signed   By: Delbert PhenixJason A Poff M.D.   On: 03/15/2016 20:20    Scheduled Meds: . [START ON 03/17/2016] azithromycin  250 mg Oral Q24H  . cefTRIAXone  2 g Intravenous Q24H  . furosemide  40 mg Oral BID  . metoprolol succinate  25 mg Oral Daily  . simvastatin  40 mg Oral q1800  . warfarin  4 mg Oral q1800    Assessment/Plan:  1.  Clinical sepsis with bilateral pneumonia. Change antibiotics to Rocephin and Zithromax since this is community-acquired pneumonia. 2.  Acute cystitis with hematuria. So far urine cultures negative. Rocephin would cover. 3.  NSTEMI. Troponin elevation above 1. Likely demand ischemia and stress on the heart secondary to sepsis. Patient on aspirin and simvastatin. Case discussed with Dr. Lady GaryFath cardiology to see the patient. Echocardiogram ordered. 4. Chronic systolic congestive heart failure. Continue home medication of Lasix and metoprolol. Consider  ACE inhibitor. 5. Chronic atrial fibrillation on anticoagulation with Coumadin. INR therapeutic. 6. Weakness physical therapy evaluation 7. Hyperlipidemia unspecified on simvastatin 8. Chronic kidney disease stage III. Watch with diuresis.  Code Status:     Code Status Orders        Start     Ordered   03/15/16 2340  Full code  Continuous     03/15/16 2340    Code Status History    Date Active Date Inactive Code Status Order ID Comments User Context   12/10/2015  8:05 PM 12/13/2015 11:04 AM Full Code [id number]  Houston SirenVivek J Sainani, MD Inpatient   06/03/2015  4:38 AM 06/03/2015  3:03 PM Full Code [id number]  Enedina FinnerSona Patel, MD Inpatient      Disposition Plan: To be determined  Consultants:  Cardiology  Antibiotics:  Rocephin  Zithromax  Time spent: 30 minutes  Alford HighlandWIETING, Lyndsee Casa  [employer]

## 2016-03-16 NOTE — Care Management Important Message (Signed)
Important Message  Patient Details  Name: Vincent [name] MRN: [id number] Date of Birth: 07-13-1919   Medicare Important Message Given:  Yes    Eber HongGreene, Shantal Roan R, RN 03/16/2016, 5:28 PM

## 2016-03-16 NOTE — Progress Notes (Signed)
MD Sheryle Hailiamond made aware of new troponin levels. No new orders received.

## 2016-03-16 NOTE — Progress Notes (Signed)
Pharmacy Antibiotic Note  Vincent [name] is a 82 y.o. male admitted on 03/15/2016 with sepsis.  Pharmacy has been consulted for vancomycin and Ziosyn dosing.  Plan: DW 78 kg  Vd 55L kei 0.036 hr-1  T1/2 19 hours Vancomycin 1250 mg q 24 hours ordered with stacked dosing. Level before 5th dose. Goal trough 15-20.  Zosyn 3.375 grams q 8 hours EI ordered.  Height: 5\' 7"  (170.2 cm) Weight: 209 lb 8 oz (95 kg) IBW/kg (Calculated) : 66.1  Temp (24hrs), Avg:99.3 F (37.4 C), Min:98.1 F (36.7 C), Max:100.5 F (38.1 C)   Recent Labs Lab 03/15/16 2003  WBC 13.9*  CREATININE 1.32*  LATICACIDVEN 1.2    Estimated Creatinine Clearance: 37.6 mL/min (by C-G formula based on SCr of 1.32 mg/dL (H)).    Allergies  Allergen Reactions  . Atorvastatin     Other reaction([name]): Unknown  . Codeine Nausea Only  . Meperidine Nausea Only    Antimicrobials this admission: vancomycin 12/28 >>  Zosyn 12/28 >>   Dose adjustments this admission:   Microbiology results: 12/28 BCx: pending 12/28 UCx: pending     12/28 UA: LE(+) NO2(-) WBC TNTC 12/28 CXR: patchy bibasilar opacities.  Thank you for allowing pharmacy to be a part of this patient'[name] care.  Vincent [name] 03/16/2016 12:29 AM

## 2016-03-16 NOTE — Care Management (Signed)
Patient presented from home and admitted with sepsis due to  pneumonia and or UTI. He does not have home 02 or services in the home. Has elevated troponin and cardiology consulted and troponin elevation not felt to be acs.  Patient has EF 10%.  He is a full code   Physical therapy consult pending.  Patient is very weakened by his current illness.  Suggested palliative care consult for goals of treatment and code status

## 2016-03-16 NOTE — Progress Notes (Signed)
ANTICOAGULATION CONSULT NOTE - Initial Consult  Pharmacy Consult for warfarin dosing Indication: atrial fibrillation  Allergies  Allergen Reactions  . Atorvastatin     Other reaction(s): Unknown  . Codeine Nausea Only  . Meperidine Nausea Only    Patient Measurements: Height: 5\' 7"  (170.2 cm) Weight: 209 lb 8 oz (95 kg) IBW/kg (Calculated) : 66.1 Heparin Dosing Weight: n/a  Vital Signs: Temp: 98.1 F (36.7 C) (12/28 2348) Temp Source: Oral (12/28 2348) BP: 121/63 (12/28 2348) Pulse Rate: 77 (12/28 2348)  Labs:  Recent Labs  03/15/16 2003 03/15/16 2030  HGB 12.8*  --   HCT 38.4*  --   PLT 129*  --   LABPROT  --  24.9*  INR  --  2.21  CREATININE 1.32*  --   TROPONINI 0.39*  --     Estimated Creatinine Clearance: 37.6 mL/min (by C-G formula based on SCr of 1.32 mg/dL (H)).   Medical History: Past Medical History:  Diagnosis Date  . AICD (automatic cardioverter/defibrillator) present   . Atrial fibrillation (HCC)   . CHF (congestive heart failure) (HCC)   . CKD (chronic kidney disease)   . Coronary artery disease   . GERD (gastroesophageal reflux disease)   . Presence of permanent cardiac pacemaker     Medications:  Home regimen 4 mg daily per med rec.  Assessment: INR therapeutic on admission.  Goal of Therapy:  INR 2-3   Plan:  Resume home dose of 4 mg daily. INR daily while on antibiotics.  Vincent [name] S 03/16/2016,12:34 AM

## 2016-03-17 ENCOUNTER — Inpatient Hospital Stay
Admit: 2016-03-17 | Discharge: 2016-03-17 | Disposition: A | Discharge status: Home / Self Care | Payer: Medicare PPO | Attending: Internal Medicine | Admitting: Internal Medicine

## 2016-03-17 LAB — BASIC METABOLIC PANEL
ANION GAP: 9 (ref 5–15)
BUN: 24 mg/dL — ABNORMAL HIGH (ref 6–20)
CALCIUM: 8.5 mg/dL — AB (ref 8.9–10.3)
CHLORIDE: 107 mmol/L (ref 101–111)
CO2: 23 mmol/L (ref 22–32)
CREATININE: 1.19 mg/dL (ref 0.61–1.24)
GFR calc non Af Amer: 50 mL/min — ABNORMAL LOW (ref >60)
GFR, EST AFRICAN AMERICAN: 58 mL/min — AB (ref >60)
Glucose, Bld: 118 mg/dL — ABNORMAL HIGH (ref 65–99)
Potassium: 3.3 mmol/L — ABNORMAL LOW (ref 3.5–5.1)
SODIUM: 139 mmol/L (ref 135–145)

## 2016-03-17 LAB — BLOOD GAS, VENOUS
Acid-base deficit: 1.7 mmol/L (ref 0.0–2.0)
Bicarbonate: 21.9 mmol/L (ref 20.0–28.0)
O2 SAT: 86.1 %
PCO2 VEN: 33 mmHg — AB (ref 44.0–60.0)
PO2 VEN: 50 mmHg — AB (ref 32.0–45.0)
Patient temperature: 37
pH, Ven: 7.43 (ref 7.250–7.430)

## 2016-03-17 LAB — CBC
HCT: 37.6 % — ABNORMAL LOW (ref 40.0–52.0)
Hemoglobin: 12.8 g/dL — ABNORMAL LOW (ref 13.0–18.0)
MCH: 31.9 pg (ref 26.0–34.0)
MCHC: 34.1 g/dL (ref 32.0–36.0)
MCV: 93.4 fL (ref 80.0–100.0)
PLATELETS: 139 10*3/uL — AB (ref 150–440)
RBC: 4.02 MIL/uL — AB (ref 4.40–5.90)
RDW: 14.5 % (ref 11.5–14.5)
WBC: 8.9 10*3/uL (ref 3.8–10.6)

## 2016-03-17 LAB — PROTIME-INR
INR: 2.06
Prothrombin Time: 23.5 seconds — ABNORMAL HIGH (ref 11.4–15.2)

## 2016-03-17 MED ORDER — GALANTAMINE HYDROBROMIDE 4 MG PO TABS
4.0000 mg | ORAL_TABLET | Freq: Every day | ORAL | Status: DC
  Administered 2016-03-17 – 2016-03-20 (×4): 4 mg via ORAL
  Filled 2016-03-17 (×4): qty 1

## 2016-03-17 MED ORDER — POTASSIUM CHLORIDE 20 MEQ PO PACK: 40.0000 meq | PACK | Freq: Once | ORAL | Status: DC

## 2016-03-17 MED ORDER — LORAZEPAM 0.5 MG PO TABS: 0.5000 mg | ORAL_TABLET | Freq: Four times a day (QID) | ORAL | Status: DC | PRN

## 2016-03-17 MED ORDER — GUAIFENESIN ER 600 MG PO TB12
600.0000 mg | ORAL_TABLET | Freq: Two times a day (BID) | ORAL | Status: DC
  Administered 2016-03-17 – 2016-03-20 (×7): 600 mg via ORAL
  Filled 2016-03-17 (×7): qty 1

## 2016-03-17 MED ORDER — POTASSIUM CHLORIDE CRYS ER 20 MEQ PO TBCR
20.0000 meq | EXTENDED_RELEASE_TABLET | Freq: Once | ORAL | Status: AC
  Administered 2016-03-17: 20 meq via ORAL
  Filled 2016-03-17: qty 1

## 2016-03-17 NOTE — Progress Notes (Signed)
[facility]  SUBJECTIVE: Still slightly confused but breathing more comfortably.   Vitals:   03/16/16 1226 03/16/16 1928 03/17/16 0452 03/17/16 0711  BP: (!) 143/66 (!) 159/73 140/70 (!) 154/73  Pulse: 77 81 79 75  Resp:  18 18   Temp:  99.5 F (37.5 C) 98.7 F (37.1 C) 98 F (36.7 C)  TempSrc:  Oral Oral Oral  SpO2: 100% 98% 97% 98%  Weight:   209 lb 3.2 oz (94.9 kg)   Height:        Intake/Output Summary (Last 24 hours) at 03/17/16 0955 Last data filed at 03/17/16 0736  Gross per 24 hour  Intake              120 ml  Output             2050 ml  Net            -1930 ml    LABS: Basic Metabolic Panel:  Recent Labs  96/06/5410/29/17 0627 03/17/16 0551  NA 139 139  K 3.5 3.3*  CL 109 107  CO2 22 23  GLUCOSE 115* 118*  BUN 28* 24*  CREATININE 1.27* 1.19  CALCIUM 8.2* 8.5*   Liver Function Tests:  Recent Labs  03/15/16 2003  AST 33  ALT 22  ALKPHOS 175*  BILITOT 1.3*  PROT 6.5  ALBUMIN 3.2*   No results for input(s): LIPASE, AMYLASE in the last 72 hours. CBC:  Recent Labs  03/15/16 2003 03/16/16 0627 03/17/16 0551  WBC 13.9* 11.0* 8.9  NEUTROABS 12.6*  --   --   HGB 12.8* 12.2* 12.8*  HCT 38.4* 35.5* 37.6*  MCV 93.7 93.5 93.4  PLT 129* 116* 139*   Cardiac Enzymes:  Recent Labs  03/16/16 0021 03/16/16 0627 03/16/16 0939  TROPONINI 1.05* 1.05* 0.94*   BNP: Invalid input(s): POCBNP D-Dimer: No results for input(s): DDIMER in the last 72 hours. Hemoglobin A1C: No results for input(s): HGBA1C in the last 72 hours. Fasting Lipid Panel: No results for input(s): CHOL, HDL, LDLCALC, TRIG, CHOLHDL, LDLDIRECT in the last 72 hours. Thyroid Function Tests: No results for input(s): TSH, T4TOTAL, T3FREE, THYROIDAB in the last 72 hours.  Invalid input(s): FREET3 Anemia Panel: No results for input(s): VITAMINB12, FOLATE, FERRITIN, TIBC, IRON, RETICCTPCT in the last 72 hours.   Physical Exam: Blood pressure (!)  154/73, pulse 75, temperature 98 F (36.7 C), temperature source Oral, resp. rate 18, height 5\' 7"  (1.702 m), weight 209 lb 3.2 oz (94.9 kg), SpO2 98 %.   Wt Readings from Last 1 Encounters:  03/17/16 209 lb 3.2 oz (94.9 kg)     General appearance: cooperative Resp: rhonchi bibasilar Cardio: Regular ventricular paced rhythm GI: soft, non-tender; bowel sounds normal; no masses,  no organomegaly Extremities: extremities normal, atraumatic, no cyanosis or edema Neurologic: Grossly normal  TELEMETRY: Reviewed telemetry pt in ventricular paced rhythm:  ASSESSMENT AND PLAN:  Principal Problem:   Sepsis (HCC)-currently on antibiotics. We'll continue with this and follow Active Problems:   CAP (community acquired pneumonia)   Chronic systolic CHF (congestive heart failure) (HCC)   Atrial fibrillation (HCC)-rate is controlled. Ventricular paced. No ventricular or supraventricular tachyarrhythmias at present. Continue with metoprolol succinate at 25 mg daily, anticoagulation with warfarin with an INR goal between 2 and 3. We'll need to closely follow this while on antibiotics.   CAD (coronary artery disease) and currently stable.   UTI (urinary tract infection)    Dalia HeadingKenneth A Montrae Braithwaite,  MD, [facility] 03/17/2016 9:55 AM

## 2016-03-17 NOTE — Evaluation (Signed)
Physical Therapy Evaluation Patient Details Name: Vincent [name] MRN: [id number] DOB: 08-07-1919 Today's Date: 03/17/2016   History of Present Illness  presented to ER secondary to fever and malaise, progressive SOB x3-4 days; admitted with sepsis related to UTI, acute cystitis and NSTEMI (troponin peak at 1.05, downtrending, demand ischemia per cards).  Clinical Impression  Upon evaluation, patient alert and oriented to self, general location; intermittently confused to more complex information.  Bilat UE/LE generally weak and deconditioned (baseline ROM deficits to L shoulder noted), grossly at least 3+ to 4-/5 throughout.  Currently requiring mod assist for bed mobility; min/mod assist for sit/stand and short-distance gait (5 steps forward/backward) with RW.  Generally unsteady and unsafe to complete without RW and hands-on assist at all times.  Notably fatigued with minimal activity, requiring seated rest period after above-noted gait efforts. Unable to demonstrate ability to tolerate household distances (or stairs required for safe entry/exit) at this time. Would benefit from skilled PT to address above deficits and promote optimal return to PLOF; recommend transition to STR upon discharge from acute hospitalization.     Follow Up Recommendations SNF    Equipment Recommendations  Rolling walker with 5" wheels    Recommendations for Other Services       Precautions / Restrictions Precautions Precautions: Fall;ICD/Pacemaker Restrictions Weight Bearing Restrictions: No      Mobility  Bed Mobility Overal bed mobility: Needs Assistance Bed Mobility: Supine to Sit     Supine to sit: Mod assist        Transfers Overall transfer level: Needs assistance Equipment used: Rolling walker (2 wheeled) Transfers: Sit to/from Stand Sit to Stand: Mod assist            Ambulation/Gait Ambulation/Gait assistance: Min assist;Mod assist Ambulation Distance (Feet): 5 Feet  (forward/backward stepping) Assistive device: Rolling walker (2 wheeled)       General Gait Details: forward flexed posture, very shuffling gait pattern. Notably SOB with minimal activity/distance; unable to tolerate additional distance due to fatigue (sats maintained >98% on 3L).  Do recommend hands-on assist at all times due to generalized weakness and balance deficits.  Stairs            Wheelchair Mobility    Modified Rankin (Stroke Patients Only)       Balance Overall balance assessment: Needs assistance Sitting-balance support: No upper extremity supported;Feet supported Sitting balance-Leahy Scale: Good     Standing balance support: Bilateral upper extremity supported Standing balance-Leahy Scale: Fair                               Pertinent Vitals/Pain Pain Assessment: No/denies pain    Home Living Family/patient expects to be discharged to:: Private residence Living Arrangements: Alone Available Help at Discharge: Family;Available PRN/intermittently Type of Home: House Home Access: Stairs to enter Entrance Stairs-Rails: Right Entrance Stairs-Number of Steps: 1 + 1 Home Layout: One level Home Equipment: Walker - 2 wheels;Cane - single point      Prior Function Level of Independence: Independent with assistive device(s)         Comments: Mod indep with ADLs, limited household mobility with intermittent use of SPC.  Does have hired aide several times a week for assist with chores; family provides frequent check in     Hand Dominance   Dominant Hand: Right    Extremity/Trunk Assessment   Upper Extremity Assessment Upper Extremity Assessment:  (R UE grossly WFL; L UE with  chronic shoulder ROM deficits (history of break >30 years prior), [facility])    Lower Extremity Assessment Lower Extremity Assessment: Generalized weakness (grossly 4-/5 throughout; unable to achieve full knee extension (R > L))       Communication    Communication: HOH  Cognition Arousal/Alertness: Awake/alert Behavior During Therapy: WFL for tasks assessed/performed Overall Cognitive Status: Within Functional Limits for tasks assessed                 General Comments: intermittently confused, but follows commands    General Comments      Exercises     Assessment/Plan    PT Assessment Patient needs continued PT services  PT Problem List Decreased strength;Decreased range of motion;Decreased activity tolerance;Decreased balance;Decreased mobility;Decreased coordination;Decreased cognition;Decreased knowledge of use of DME;Decreased safety awareness;Decreased knowledge of precautions;Cardiopulmonary status limiting activity          PT Treatment Interventions DME instruction;Gait training;Stair training;Functional mobility training;Therapeutic activities;Therapeutic exercise;Balance training;Cognitive remediation;Patient/family education    PT Goals (Current goals can be found in the Care Plan section)  Acute Rehab PT Goals Patient Stated Goal: per family, to return home if possible PT Goal Formulation: With patient/family Time For Goal Achievement: 03/31/16 Potential to Achieve Goals: Fair    Frequency Min 2X/week   Barriers to discharge Decreased caregiver support      Co-evaluation               End of Session Equipment Utilized During Treatment: Gait belt;Oxygen Activity Tolerance: Patient limited by fatigue Patient left: in bed;with call bell/phone within reach;with bed alarm set;with family/visitor present Nurse Communication: Mobility status         Time: [id number] PT Time Calculation (min) (ACUTE ONLY): 22 min   Charges:   PT Evaluation $PT Eval Moderate Complexity: 1 Procedure     PT G Codes:        Kato Wieczorek H. Manson PasseyBrown, PT, DPT, NCS 03/17/16, 12:26 PM [phone]

## 2016-03-17 NOTE — Progress Notes (Addendum)
Patient ID: Vincent [name], male   DOB: April 04, 1919, 82 y.o.   MRN: [id number]  [employer] PROGRESS NOTE  Vincent [name] YQM:[id number] DOB: April 04, 1919 DOA: 03/15/2016 PCP: Danella PentonMark F Miller, MD  HPI/Subjective: C/o cough and congestion no chest pain  Objective: Vitals:   03/17/16 0711 03/17/16 1115  BP: (!) 154/73 (!) 133/55  Pulse: 75 75  Resp: 18 18  Temp: 98 F (36.7 C) 98.5 F (36.9 C)    Filed Weights   03/15/16 2348 03/16/16 0402 03/17/16 0452  Weight: 209 lb 8 oz (95 kg) 209 lb 7 oz (95 kg) 209 lb 3.2 oz (94.9 kg)    ROS: Review of Systems  Constitutional: Negative for chills and fever.  Eyes: Negative for blurred vision.  Respiratory: Positive for cough and shortness of breath.   Cardiovascular: Negative for chest pain.  Gastrointestinal: Negative for abdominal pain, constipation, diarrhea, nausea and vomiting.  Genitourinary: Negative for dysuria.  Musculoskeletal: Negative for joint pain.  Neurological: Negative for dizziness and headaches.   Exam: Physical Exam  Constitutional: He is oriented to person, place, and time.  HENT:  Nose: No mucosal edema.  Mouth/Throat: No oropharyngeal exudate or posterior oropharyngeal edema.  Eyes: Conjunctivae, EOM and lids are normal. Pupils are equal, round, and reactive to light.  Neck: No JVD present. Carotid bruit is not present. No edema present. No thyroid mass and no thyromegaly present.  Cardiovascular: S1 normal and S2 normal.  An irregularly irregular rhythm present. Exam reveals no gallop.   Murmur heard.  Systolic murmur is present with a grade of 2/6  Pulses:      Dorsalis pedis pulses are 2+ on the right side, and 2+ on the left side.  Respiratory: No respiratory distress. He has no decreased breath sounds. He has no wheezes. He has no rhonchi. He has no rales.  GI: Soft. Bowel sounds are normal. There is no tenderness.  Musculoskeletal:       Right ankle: He exhibits swelling.       Left ankle: He  exhibits swelling.  Lymphadenopathy:    He has no cervical adenopathy.  Neurological: He is alert and oriented to person, place, and time. No cranial nerve deficit.  Skin: Skin is warm. No rash noted. Nails show no clubbing.  Psychiatric: He has a normal mood and affect.      Data Reviewed: Basic Metabolic Panel:  Recent Labs Lab 03/15/16 2003 03/16/16 0627 03/17/16 0551  NA 137 139 139  K 3.4* 3.5 3.3*  CL 107 109 107  CO2 22 22 23   GLUCOSE 135* 115* 118*  BUN 27* 28* 24*  CREATININE 1.32* 1.27* 1.19  CALCIUM 8.3* 8.2* 8.5*   Liver Function Tests:  Recent Labs Lab 03/15/16 2003  AST 33  ALT 22  ALKPHOS 175*  BILITOT 1.3*  PROT 6.5  ALBUMIN 3.2*   CBC:  Recent Labs Lab 03/15/16 2003 03/16/16 0627 03/17/16 0551  WBC 13.9* 11.0* 8.9  NEUTROABS 12.6*  --   --   HGB 12.8* 12.2* 12.8*  HCT 38.4* 35.5* 37.6*  MCV 93.7 93.5 93.4  PLT 129* 116* 139*   Cardiac Enzymes:  Recent Labs Lab 03/15/16 2003 03/16/16 0021 03/16/16 0627 03/16/16 0939  TROPONINI 0.39* 1.05* 1.05* 0.94*     Recent Results (from the past 240 hour(s))  Blood Culture (routine x 2)     Status: None (Preliminary result)   Collection Time: 03/15/16  8:03 PM  Result Value Ref Range Status  Specimen Description BLOOD  R WRIST  Final   Special Requests BOTTLES DRAWN AEROBIC AND ANAEROBIC ANA9 AER 13ML  Final   Culture NO GROWTH 2 DAYS  Final   Report Status PENDING  Incomplete  Urine culture     Status: Abnormal (Preliminary result)   Collection Time: 03/15/16  8:03 PM  Result Value Ref Range Status   Specimen Description URINE, RANDOM  Final   Special Requests NONE  Final   Culture >=100,000 COLONIES/mL ESCHERICHIA COLI (A)  Final   Report Status PENDING  Incomplete  Blood Culture (routine x 2)     Status: None (Preliminary result)   Collection Time: 03/15/16  8:22 PM  Result Value Ref Range Status   Specimen Description BLOOD RIGHT AC  Final   Special Requests   Final     BOTTLES DRAWN AEROBIC AND ANAEROBIC AER 10ML ANA 9ML   Culture NO GROWTH 2 DAYS  Final   Report Status PENDING  Incomplete  MRSA PCR Screening     Status: None   Collection Time: 03/16/16  9:07 AM  Result Value Ref Range Status   MRSA by PCR NEGATIVE NEGATIVE Final    Comment:        The GeneXpert MRSA Assay (FDA approved for NASAL specimens only), is one component of a comprehensive MRSA colonization surveillance program. It is not intended to diagnose MRSA infection nor to guide or monitor treatment for MRSA infections.      Studies: Dg Chest Port 1 View  Result Date: 03/15/2016 CLINICAL DATA:  Fever EXAM: PORTABLE CHEST 1 VIEW COMPARISON:  12/10/2015 chest radiograph. FINDINGS: Stable configuration of median sternotomy wires, CABG clips and 3 lead right subclavian ICD. Stable cardiomediastinal silhouette with mild cardiomegaly and aortic atherosclerosis. No pneumothorax. Small bilateral pleural effusions. Stable biapical pleural thickening. Patchy bibasilar lung opacities. IMPRESSION: Stable mild cardiomegaly. Patchy bibasilar lung opacities, suspicious for pneumonia or aspiration, with a component of mild pulmonary edema not excluded. Recommend attention on follow up chest imaging. Small bilateral pleural effusions. Aortic atherosclerosis. Electronically Signed   By: Delbert PhenixJason A Poff M.D.   On: 03/15/2016 20:20    Scheduled Meds: . azithromycin  250 mg Oral Q24H  . cefTRIAXone  2 g Intravenous Q24H  . furosemide  40 mg Oral BID  . galantamine  4 mg Oral Daily  . guaiFENesin  600 mg Oral BID  . metoprolol succinate  25 mg Oral Daily  . simvastatin  40 mg Oral q1800  . warfarin  4 mg Oral q1800    Assessment/Plan:  1.  Clinical sepsis with bilateral pneumonia. Ceftriaxone azithromycin and mucinex to current therapy  2.   Acute cystitis with hematuria. So far urine cultures negative. Rocephin would cover. 3.  NSTEMI. Troponin elevation above 1. Likely demand ischemia and stress  on the heart secondary to sepsis. Patient on aspirin and simvastatin.   4. Chronic systolic congestive heart failure. Continue home medication of Lasix and metoprolol. Consider ACE inhibitor. 5. Chronic atrial fibrillation on anticoagulation with Coumadin. I pharmacy managing anticoagulation 6. Weakness physical therapy evaluation 7. Hyperlipidemia unspecified on simvastatin 8. Chronic kidney disease stage III. Watch with diuresis.  Code Status:     Code Status Orders        Start     Ordered   03/15/16 2340  Full code  Continuous     03/15/16 2340    Code Status History    Date Active Date Inactive Code Status Order ID Comments User Context  12/10/2015  8:05 PM 12/13/2015 11:04 AM Full Code [id number]  Houston Siren, MD Inpatient   06/03/2015  4:38 AM 06/03/2015  3:03 PM Full Code [id number]  Enedina Finner, MD Inpatient      Disposition Plan: To be determined  Consultants:  Cardiology  Antibiotics:  Rocephin  Zithromax  Time spent: 30 minutes  Christiann Hagerty, [facility]  [employer]

## 2016-03-17 NOTE — Progress Notes (Signed)
*  PRELIMINARY RESULTS* Echocardiogram 2D Echocardiogram has been performed.  Garrel Ridgelikeshia S Peace Noyes 03/17/2016, 10:58 AM

## 2016-03-17 NOTE — Progress Notes (Signed)
ANTICOAGULATION CONSULT NOTE - Follow Up Pharmacy Consult for warfarin dosing Indication: atrial fibrillation  Allergies  Allergen Reactions  . Atorvastatin     Other reaction(s): Unknown  . Codeine Nausea Only  . Meperidine Nausea Only    Patient Measurements: Height: 5\' 7"  (170.2 cm) Weight: 209 lb 3.2 oz (94.9 kg) IBW/kg (Calculated) : 66.1 Heparin Dosing Weight: n/a  Vital Signs: Temp: 98 F (36.7 C) (12/30 0711) Temp Source: Oral (12/30 0711) BP: 154/73 (12/30 0711) Pulse Rate: 75 (12/30 0711)  Labs:  Recent Labs  03/15/16 2003 03/15/16 2030 03/16/16 0021 03/16/16 0627 03/16/16 0939 03/17/16 0551  HGB 12.8*  --   --  12.2*  --  12.8*  HCT 38.4*  --   --  35.5*  --  37.6*  PLT 129*  --   --  116*  --  139*  LABPROT  --  24.9*  --   --   --  23.5*  INR  --  2.21  --   --   --  2.06  CREATININE 1.32*  --   --  1.27*  --  1.19  TROPONINI 0.39*  --  1.05* 1.05* 0.94*  --     Estimated Creatinine Clearance: 41.7 mL/min (by C-G formula based on SCr of 1.19 mg/dL).   Medical History: Past Medical History:  Diagnosis Date  . AICD (automatic cardioverter/defibrillator) present   . Atrial fibrillation (HCC)   . CHF (congestive heart failure) (HCC)   . CKD (chronic kidney disease)   . Coronary artery disease   . GERD (gastroesophageal reflux disease)   . Presence of permanent cardiac pacemaker     Medications:  Home regimen 4 mg daily per med rec.  Assessment: INR therapeutic on admission.  12/28 INR 2.21,  12/29 Warfarin 4mg  given 12/30 INR 2.06  Goal of Therapy:  INR 2-3  Plan:  Continue home dose of Warfarin 4 mg daily. Will check INR daily while on antibiotics.  Stormy CardKatsoudas,Nicolaas Savo K, RPh Clinical Pharmacist 03/17/2016,9:23 AM

## 2016-03-18 LAB — ECHOCARDIOGRAM COMPLETE
Height: 67 in
Weight: 3347.2 oz

## 2016-03-18 LAB — CBC
HCT: 38.6 % — ABNORMAL LOW (ref 40.0–52.0)
HEMOGLOBIN: 13.2 g/dL (ref 13.0–18.0)
MCH: 31.6 pg (ref 26.0–34.0)
MCHC: 34.3 g/dL (ref 32.0–36.0)
MCV: 92.1 fL (ref 80.0–100.0)
PLATELETS: 153 10*3/uL (ref 150–440)
RBC: 4.19 MIL/uL — ABNORMAL LOW (ref 4.40–5.90)
RDW: 14.4 % (ref 11.5–14.5)
WBC: 9.2 10*3/uL (ref 3.8–10.6)

## 2016-03-18 LAB — PROTIME-INR
INR: 2.22
Prothrombin Time: 25 seconds — ABNORMAL HIGH (ref 11.4–15.2)

## 2016-03-18 LAB — PROCALCITONIN: PROCALCITONIN: 8.44 ng/mL

## 2016-03-18 LAB — BASIC METABOLIC PANEL
Anion gap: 9 (ref 5–15)
BUN: 20 mg/dL (ref 6–20)
CALCIUM: 8.3 mg/dL — AB (ref 8.9–10.3)
CHLORIDE: 106 mmol/L (ref 101–111)
CO2: 24 mmol/L (ref 22–32)
CREATININE: 1.12 mg/dL (ref 0.61–1.24)
GFR, EST NON AFRICAN AMERICAN: 54 mL/min — AB (ref >60)
Glucose, Bld: 114 mg/dL — ABNORMAL HIGH (ref 65–99)
Potassium: 3.3 mmol/L — ABNORMAL LOW (ref 3.5–5.1)
SODIUM: 139 mmol/L (ref 135–145)

## 2016-03-18 LAB — URINE CULTURE: Culture: >=100000 — AB

## 2016-03-18 MED ORDER — POTASSIUM CHLORIDE CRYS ER 20 MEQ PO TBCR
40.0000 meq | EXTENDED_RELEASE_TABLET | Freq: Once | ORAL | Status: AC
  Administered 2016-03-18: 40 meq via ORAL
  Filled 2016-03-18: qty 2

## 2016-03-18 MED ORDER — LORAZEPAM 0.5 MG PO TABS
0.5000 mg | ORAL_TABLET | Freq: Two times a day (BID) | ORAL | Status: DC | PRN
  Administered 2016-03-20: 0.5 mg via ORAL
  Filled 2016-03-18 (×2): qty 1

## 2016-03-18 MED ORDER — METHYLPREDNISOLONE SODIUM SUCC 40 MG IJ SOLR
40.0000 mg | Freq: Two times a day (BID) | INTRAMUSCULAR | Status: DC
Start: 2016-03-18 — End: 2016-03-20
  Administered 2016-03-18 – 2016-03-19 (×3): 40 mg via INTRAVENOUS
  Filled 2016-03-18 (×4): qty 1

## 2016-03-18 NOTE — NC FL2 (Signed)
Vincent [name] MEDICAID FL2 LEVEL OF CARE SCREENING TOOL     IDENTIFICATION  Patient Name: Vincent [name] Birthdate: Feb 18, 1920 Sex: male Admission Date (Current Location): 03/15/2016  [place]   Facility and Address:  [facility], [address], [place], [place] [address]      Provider Number: [id number]  Attending Physician Name and Address:  Vincent BilberryShreyang Trevan Messman, MD  Relative Name and Phone Number:     Vincent [name] Relative [phone]  [phone]             Current Level of Care: Hospital Recommended Level of Care: Skilled Nursing Facility Prior Approval Number:    Date Approved/Denied: 03/18/16 PASRR Number: [id number]  Discharge Plan: SNF    Current Diagnoses: Patient Active Problem List   Diagnosis Date Noted  . UTI (urinary tract infection) 03/16/2016  . Sepsis (HCC) 03/15/2016  . CAP (community acquired pneumonia) 03/15/2016  . Chronic systolic CHF (congestive heart failure) (HCC) 03/15/2016  . Atrial fibrillation (HCC) 03/15/2016  . GERD (gastroesophageal reflux disease) 03/15/2016  . CAD (coronary artery disease) 03/15/2016  . Cellulitis 12/10/2015    Orientation RESPIRATION BLADDER Height & Weight     Self, Time, Situation, Place  O2 (o2 2L acute) Continent Weight: 208 lb 4.8 oz (94.5 kg) Height:  5\' 7"  (170.2 cm)  BEHAVIORAL SYMPTOMS/MOOD NEUROLOGICAL BOWEL NUTRITION STATUS      Continent  Cardiac  AMBULATORY STATUS COMMUNICATION OF NEEDS Skin   Extensive Assist Verbally Other (Comment) (Multiple moles)                       Personal Care Assistance Level of Assistance  Bathing, Dressing Bathing Assistance: Limited assistance   Dressing Assistance: Limited assistance     Functional Limitations Info  Hearing   Hearing Info: Impaired (Wears hearing aides)      SPECIAL CARE FACTORS FREQUENCY  PT (By licensed PT)     PT Frequency: Up to 5X per day, 5X per week               Contractures Contractures Info: Present    Additional Factors Info  Allergies Code Status Info: Full Allergies Info: Atorvastatin, Codeine, Meperidine           Current Medications (03/18/2016):  This is the current hospital active medication list Current Facility-Administered Medications  Medication Dose Route Frequency Provider Last Rate Last Dose  . acetaminophen (TYLENOL) tablet 650 mg  650 mg Oral Q6H PRN Vincent Manisavid Willis, MD   650 mg at 03/16/16 2137   Or  . acetaminophen (TYLENOL) suppository 650 mg  650 mg Rectal Q6H PRN Vincent Manisavid Willis, MD      . azithromycin [facility](ZITHROMAX) tablet 250 mg  250 mg Oral Q24H Vincent Highlandichard Wieting, MD   250 mg at 03/17/16 1749  . benzonatate (TESSALON) capsule 200 mg  200 mg Oral TID PRN Vincent Manisavid Willis, MD      . cefTRIAXone (ROCEPHIN) IVPB 2 g  2 g Intravenous Q24H Vincent Highlandichard Wieting, MD   2 g at 03/17/16 1749  . furosemide (LASIX) tablet 40 mg  40 mg Oral BID Vincent Manisavid Willis, MD   40 mg at 03/18/16 [id number]  . galantamine (RAZADYNE) tablet 4 mg  4 mg Oral Daily Vincent BilberryShreyang Neyland Pettengill, MD   4 mg at 03/18/16 0919  . guaiFENesin (MUCINEX) 12 hr tablet 600 mg  600 mg Oral BID Vincent BilberryShreyang Damin Salido, MD   600 mg at 03/18/16 0921  . guaiFENesin-dextromethorphan (ROBITUSSIN DM) 100-10  MG/5ML syrup 5 mL  5 mL Oral Q4H PRN Vincent Manisavid Willis, MD      . ipratropium-albuterol (DUONEB) 0.5-2.5 (3) MG/3ML nebulizer solution 3 mL  3 mL Nebulization Q4H PRN Vincent Manisavid Willis, MD      . LORazepam (ATIVAN) tablet 0.5 mg  0.5 mg Oral Q12H PRN Vincent BilberryShreyang Chestine Belknap, MD      . methylPREDNISolone sodium succinate (SOLU-MEDROL) 40 mg/mL injection 40 mg  40 mg Intravenous Q12H Vincent BilberryShreyang Makarios Madlock, MD   40 mg at 03/18/16 0919  . metoprolol succinate (TOPROL-XL) 24 hr tablet 25 mg  25 mg Oral Daily Vincent Manisavid Willis, MD   25 mg at 03/18/16 0919  . ondansetron (ZOFRAN) tablet 4 mg  4 mg Oral Q6H PRN Vincent Manisavid Willis, MD       Or  . ondansetron [facility](ZOFRAN) injection 4 mg  4 mg Intravenous Q6H PRN Vincent Manisavid Willis, MD      . simvastatin (ZOCOR)  tablet 40 mg  40 mg Oral q1800 Vincent Manisavid Willis, MD   40 mg at 03/17/16 1749  . warfarin (COUMADIN) tablet 4 mg  4 mg Oral q1800 Vincent Manisavid Willis, MD   4 mg at 03/17/16 1749     Discharge Medications: Please see discharge summary for a list of discharge medications.  Relevant Imaging Results:  Relevant Lab Results:   Additional Information SS# [id number]  Vincent CongKaren M White, LCSW   Updated Vincent [name], MSW, [place]CSWA, 03/20/16

## 2016-03-18 NOTE — Clinical Social Work Note (Signed)
Clinical Social Work Assessment  Patient Details  Name: Vincent [name] MRN: [id number] Date of Birth: 10-03-1919  Date of referral:  03/18/16               Reason for consult:  Facility Placement                Permission sought to share information with:  [facility] Contact Representative Permission granted to share information::  Yes, Verbal Permission Granted  Name::        Agency::     Relationship::     Contact Information:     Housing/Transportation Living arrangements for the past 2 months:  Single Family Home Source of Information:  Patient, Adult Children Patient Interpreter Needed:  None Criminal Activity/Legal Involvement Pertinent to Current Situation/Hospitalization:  No - Comment as needed Significant Relationships:  Adult Children Lives with:  Self Do you feel safe going back to the place where you live?  Yes Need for family participation in patient care:  No (Coment)  Care giving concerns:  STR    Social Worker assessment / plan:  CSW visited patient and his son-in-law to discuss dc planning. The patient is still unsure about SNF placement, but he did give verbal permission to send referral. At baseline, the patient lives alone and is independent in all ADLs, IADLs and is continent of both bladder and bowel. However, the son-in-law indicated out of the patient's earshot that the patient does not cook for himself; rather, he eats out. The family would prefer SNF placement but he indicated that they will not "fight" him if he wants to go home due to past issues. The patient has been to SNF before and does not want to return to that SNF [facility] The preference is for [facility].   Employment status:  Retired Database administratornsurance information:  Managed Medicare PT Recommendations:  Skilled Nursing Facility Information / Referral to community resources:  Skilled Nursing Facility  Patient/Family's Response to care:  Patient and family thanked CSW for assistance  Patient/Family's  Understanding of and Emotional Response to Diagnosis, Current Treatment, and Prognosis:  Patient has limited insight into his limitations, but the family is aware and in agreement with SNF.  Emotional Assessment Appearance:  Appears stated age Attitude/Demeanor/Rapport:   (Pleasant) Affect (typically observed):  Appropriate, Pleasant Orientation:  Oriented to Self, Oriented to Place, Oriented to  Time, Oriented to Situation Alcohol / Substance use:  Never Used Psych involvement (Current and /or in the community):  No (Comment)  Discharge Needs  Concerns to be addressed:  Care Coordination, Discharge Planning Concerns Readmission within the last 30 days:  No Current discharge risk:  Lives alone Barriers to Discharge:  Continued Medical Work up   UAL CorporationKaren M Hassan [place]shire, LCSW 03/18/2016, 3:57 PM

## 2016-03-18 NOTE — Progress Notes (Signed)
Patient ID: Vincent Sparrowhomas D Gillum, male   DOB: [birth year], 82 y.o.   MRN: [id number]  [employer] PROGRESS NOTE  Vincent [name] [id number] DOB: [birth year] DOA: 03/15/2016 PCP: Danella PentonMark F Miller, MD  HPI/Subjective:  still has cough breathing improved  Objective: Vitals:   03/18/16 0744 03/18/16 1101  BP: (!) 150/73 (!) 144/72  Pulse: 76 78  Resp: 20 17  Temp: 97.4 F (36.3 C) 98.2 F (36.8 C)    Filed Weights   03/16/16 0402 03/17/16 0452 03/18/16 0336  Weight: 209 lb 7 oz (95 kg) 209 lb 3.2 oz (94.9 kg) 208 lb 4.8 oz (94.5 kg)    ROS: Review of Systems  Constitutional: Negative for chills and fever.  Eyes: Negative for blurred vision.  Respiratory: Positive for cough and shortness of breath.   Cardiovascular: Negative for chest pain.  Gastrointestinal: Negative for abdominal pain, constipation, diarrhea, nausea and vomiting.  Genitourinary: Negative for dysuria.  Musculoskeletal: Negative for joint pain.  Neurological: Negative for dizziness and headaches.   Exam: Physical Exam  Constitutional: He is oriented to person, place, and time.  HENT:  Nose: No mucosal edema.  Mouth/Throat: No oropharyngeal exudate or posterior oropharyngeal edema.  Eyes: Conjunctivae, EOM and lids are normal. Pupils are equal, round, and reactive to light.  Neck: No JVD present. Carotid bruit is not present. No edema present. No thyroid mass and no thyromegaly present.  Cardiovascular: S1 normal and S2 normal.  An irregularly irregular rhythm present. Exam reveals no gallop.   Murmur heard.  Systolic murmur is present with a grade of 2/6  Pulses:      Dorsalis pedis pulses are 2+ on the right side, and 2+ on the left side.  Respiratory: No respiratory distress. He has no decreased breath sounds. He has no wheezes. He has no rhonchi. He has no rales.  GI: Soft. Bowel sounds are normal. There is no tenderness.  Musculoskeletal:       Right ankle: He exhibits swelling.       Left ankle: He  exhibits swelling.  Lymphadenopathy:    He has no cervical adenopathy.  Neurological: He is alert and oriented to person, place, and time. No cranial nerve deficit.  Skin: Skin is warm. No rash noted. Nails show no clubbing.  Psychiatric: He has a normal mood and affect.      Data Reviewed: Basic Metabolic Panel:  Recent Labs Lab 03/15/16 2003 03/16/16 0627 03/17/16 0551 03/18/16 0458  NA 137 139 139 139  K 3.4* 3.5 3.3* 3.3*  CL 107 109 107 106  CO2 22 22 23 24   GLUCOSE 135* 115* 118* 114*  BUN 27* 28* 24* 20  CREATININE 1.32* 1.27* 1.19 1.12  CALCIUM 8.3* 8.2* 8.5* 8.3*   Liver Function Tests:  Recent Labs Lab 03/15/16 2003  AST 33  ALT 22  ALKPHOS 175*  BILITOT 1.3*  PROT 6.5  ALBUMIN 3.2*   CBC:  Recent Labs Lab 03/15/16 2003 03/16/16 0627 03/17/16 0551 03/18/16 0458  WBC 13.9* 11.0* 8.9 9.2  NEUTROABS 12.6*  --   --   --   HGB 12.8* 12.2* 12.8* 13.2  HCT 38.4* 35.5* 37.6* 38.6*  MCV 93.7 93.5 93.4 92.1  PLT 129* 116* 139* 153   Cardiac Enzymes:  Recent Labs Lab 03/15/16 2003 03/16/16 0021 03/16/16 0627 03/16/16 0939  TROPONINI 0.39* 1.05* 1.05* 0.94*     Recent Results (from the past 240 hour(s))  Blood Culture (routine x 2)     Status:  None (Preliminary result)   Collection Time: 03/15/16  8:03 PM  Result Value Ref Range Status   Specimen Description BLOOD  R WRIST  Final   Special Requests BOTTLES DRAWN AEROBIC AND ANAEROBIC ANA9 AER 13ML  Final   Culture NO GROWTH 3 DAYS  Final   Report Status PENDING  Incomplete  Urine culture     Status: Abnormal   Collection Time: 03/15/16  8:03 PM  Result Value Ref Range Status   Specimen Description URINE, RANDOM  Final   Special Requests NONE  Final   Culture >=100,000 COLONIES/mL ESCHERICHIA COLI (A)  Final   Report Status 03/18/2016 FINAL  Final   Organism ID, Bacteria ESCHERICHIA COLI (A)  Final      Susceptibility   Escherichia coli - MIC*    AMPICILLIN 4 SENSITIVE Sensitive      CEFAZOLIN <=4 SENSITIVE Sensitive     CEFTRIAXONE <=1 SENSITIVE Sensitive     CIPROFLOXACIN <=0.25 SENSITIVE Sensitive     GENTAMICIN <=1 SENSITIVE Sensitive     IMIPENEM <=0.25 SENSITIVE Sensitive     NITROFURANTOIN <=16 SENSITIVE Sensitive     TRIMETH/SULFA <=20 SENSITIVE Sensitive     AMPICILLIN/SULBACTAM <=2 SENSITIVE Sensitive     PIP/TAZO <=4 SENSITIVE Sensitive     Extended ESBL NEGATIVE Sensitive     * >=100,000 COLONIES/mL ESCHERICHIA COLI  Blood Culture (routine x 2)     Status: None (Preliminary result)   Collection Time: 03/15/16  8:22 PM  Result Value Ref Range Status   Specimen Description BLOOD RIGHT AC  Final   Special Requests   Final    BOTTLES DRAWN AEROBIC AND ANAEROBIC AER 10ML ANA 9ML   Culture NO GROWTH 3 DAYS  Final   Report Status PENDING  Incomplete  MRSA PCR Screening     Status: None   Collection Time: 03/16/16  9:07 AM  Result Value Ref Range Status   MRSA by PCR NEGATIVE NEGATIVE Final    Comment:        The GeneXpert MRSA Assay (FDA approved for NASAL specimens only), is one component of a comprehensive MRSA colonization surveillance program. It is not intended to diagnose MRSA infection nor to guide or monitor treatment for MRSA infections.      Studies: No results found.  Scheduled Meds: . azithromycin  250 mg Oral Q24H  . cefTRIAXone  2 g Intravenous Q24H  . furosemide  40 mg Oral BID  . galantamine  4 mg Oral Daily  . guaiFENesin  600 mg Oral BID  . methylPREDNISolone (SOLU-MEDROL) injection  40 mg Intravenous Q12H  . metoprolol succinate  25 mg Oral Daily  . simvastatin  40 mg Oral q1800  . warfarin  4 mg Oral q1800    Assessment/Plan:  1.  Clinical sepsis with bilateral pneumonia.  Continue Ceftriaxone azithromycin reepat cxr in am 2.   Acute cystitis with hematuria. So far urine cultures negative. Rocephin would cover. 3.  NSTEMI. Troponin elevation above 1. Likely demand ischemia and stress on the heart secondary to sepsis.  Patient on aspirin and simvastatin.  No chest pain 4. Chronic systolic congestive heart failure. Continue home medication of Lasix and metoprolol.   5. Chronic atrial fibrillation on anticoagulation with Coumadin.  I pharmacy managing anticoagulation 6. Weakness physical therapy evaluation 7. Hyperlipidemia unspecified on simvastatin 8. Chronic kidney disease stage III. Watch with diuresis.  Code Status:     Code Status Orders        Start  Ordered   03/15/16 2340  Full code  Continuous     03/15/16 2340    Code Status History    Date Active Date Inactive Code Status Order ID Comments User Context   12/10/2015  8:05 PM 12/13/2015 11:04 AM Full Code [id number]  Houston Siren, MD Inpatient   06/03/2015  4:38 AM 06/03/2015  3:03 PM Full Code [id number]  Enedina Finner, MD Inpatient      Disposition Plan: To be determined  Consultants:  Cardiology  Antibiotics:  Rocephin  Zithromax  Time spent: 30 minutes  Cara Aguino, [facility]  [employer]

## 2016-03-18 NOTE — Progress Notes (Addendum)
ANTICOAGULATION CONSULT NOTE - Follow Up Pharmacy Consult for warfarin dosing Indication: atrial fibrillation  Allergies  Allergen Reactions  . Atorvastatin     Other reaction(s): Unknown  . Codeine Nausea Only  . Meperidine Nausea Only    Patient Measurements: Height: 5\' 7"  (170.2 cm) Weight: 208 lb 4.8 oz (94.5 kg) IBW/kg (Calculated) : 66.1 Heparin Dosing Weight: n/a  Vital Signs: Temp: 97.4 F (36.3 C) (12/31 0744) Temp Source: Oral (12/31 0744) BP: 150/73 (12/31 0744) Pulse Rate: 76 (12/31 0744)  Labs:  Recent Labs  03/15/16 2030 03/16/16 0021 03/16/16 [id number] 03/16/16 0939 03/17/16 0551 03/18/16 0458  HGB  --   --  12.2*  --  12.8* 13.2  HCT  --   --  35.5*  --  37.6* 38.6*  PLT  --   --  116*  --  139* 153  LABPROT 24.9*  --   --   --  23.5* 25.0*  INR 2.21  --   --   --  2.06 2.22  CREATININE  --   --  1.27*  --  1.19 1.12  TROPONINI  --  1.05* 1.05* 0.94*  --   --     Estimated Creatinine Clearance: 44.2 mL/min (by C-G formula based on SCr of 1.12 mg/dL).   Medical History: Past Medical History:  Diagnosis Date  . AICD (automatic cardioverter/defibrillator) present   . Atrial fibrillation (HCC)   . CHF (congestive heart failure) (HCC)   . CKD (chronic kidney disease)   . Coronary artery disease   . GERD (gastroesophageal reflux disease)   . Presence of permanent cardiac pacemaker     Medications:  Home regimen 4 mg daily per med rec.  Assessment: INR therapeutic on admission.  12/28 INR 2.21,  12/29 Warfarin 4mg  given 12/30 INR 2.06, Warfarin 4mg  given 12/31 INR 2.22  Goal of Therapy:  INR 2-3  Plan:  Continue home dose of Warfarin 4 mg daily. Will check INR daily while on antibiotics.  Stormy CardKatsoudas,Lucca Ballo K, RPh Clinical Pharmacist 03/18/2016,10:16 AM

## 2016-03-19 ENCOUNTER — Inpatient Hospital Stay: Payer: Medicare PPO

## 2016-03-19 LAB — BASIC METABOLIC PANEL
ANION GAP: 10 (ref 5–15)
BUN: 26 mg/dL — AB (ref 6–20)
CALCIUM: 8.8 mg/dL — AB (ref 8.9–10.3)
CO2: 25 mmol/L (ref 22–32)
Chloride: 105 mmol/L (ref 101–111)
Creatinine, Ser: 1.05 mg/dL (ref 0.61–1.24)
GFR calc Af Amer: >60 mL/min (ref >60)
GFR, EST NON AFRICAN AMERICAN: 59 mL/min — AB (ref >60)
GLUCOSE: 179 mg/dL — AB (ref 65–99)
Potassium: 3.8 mmol/L (ref 3.5–5.1)
Sodium: 140 mmol/L (ref 135–145)

## 2016-03-19 LAB — PROTIME-INR
INR: 2.54
PROTHROMBIN TIME: 27.8 s — AB (ref 11.4–15.2)

## 2016-03-19 MED ORDER — CEFUROXIME AXETIL 500 MG PO TABS: 500.0000 mg | ORAL_TABLET | Freq: Two times a day (BID) | ORAL | 0 refills | Status: AC

## 2016-03-19 MED ORDER — WARFARIN SODIUM 1 MG PO TABS
3.0000 mg | ORAL_TABLET | Freq: Once | ORAL | Status: AC
Start: 2016-03-19 — End: 2016-03-19
  Administered 2016-03-19: 3 mg via ORAL
  Filled 2016-03-19: qty 3

## 2016-03-19 MED ORDER — GALANTAMINE HYDROBROMIDE 4 MG PO TABS
4.0000 mg | ORAL_TABLET | Freq: Every day | ORAL | Status: AC
Start: 2016-03-19 — End: ?

## 2016-03-19 MED ORDER — CEFUROXIME AXETIL 500 MG PO TABS
500.0000 mg | ORAL_TABLET | Freq: Two times a day (BID) | ORAL | Status: DC
  Administered 2016-03-19 – 2016-03-20 (×2): 500 mg via ORAL
  Filled 2016-03-19 (×2): qty 1

## 2016-03-19 MED ORDER — POTASSIUM CHLORIDE 20 MEQ PO PACK
40.0000 meq | PACK | Freq: Once | ORAL | Status: AC
  Administered 2016-03-19: 40 meq via ORAL
  Filled 2016-03-19: qty 2

## 2016-03-19 MED ORDER — GUAIFENESIN ER 600 MG PO TB12: 600.0000 mg | ORAL_TABLET | Freq: Two times a day (BID) | ORAL | Status: AC

## 2016-03-19 MED ORDER — WARFARIN - PHARMACIST DOSING INPATIENT: Freq: Every day | Status: DC

## 2016-03-19 MED ORDER — BENZONATATE 200 MG PO CAPS: 200.0000 mg | ORAL_CAPSULE | Freq: Three times a day (TID) | ORAL | 0 refills | Status: AC | PRN

## 2016-03-19 MED ORDER — GUAIFENESIN-DM 100-10 MG/5ML PO SYRP
5.0000 mL | ORAL_SOLUTION | ORAL | 0 refills | Status: AC | PRN
Start: 2016-03-19 — End: ?

## 2016-03-19 MED ORDER — AZITHROMYCIN 250 MG PO TABS: ORAL_TABLET | ORAL | 0 refills | Status: AC

## 2016-03-19 MED ORDER — POTASSIUM CHLORIDE 20 MEQ PO PACK: 20.0000 meq | PACK | Freq: Every day | ORAL | Status: AC

## 2016-03-19 NOTE — Progress Notes (Signed)
Patient ID: Vincent [name], male   DOB: 08/06/1919, 83 y.o.   MRN: [id number]  [employer] PROGRESS NOTE  Vincent Sparrowhomas D Bearden JYN:[id number] DOB: 08/06/1919 DOA: 03/15/2016 PCP: Danella PentonMark F Miller, MD  HPI/Subjective:  Feeling better shortness of breath improved  Objective: Vitals:   03/19/16 0913 03/19/16 1144  BP: (!) 147/79 135/68  Pulse: 78 77  Resp: 16 20  Temp:  97.7 F (36.5 C)    Filed Weights   03/17/16 0452 03/18/16 0336 03/19/16 0500  Weight: 209 lb 3.2 oz (94.9 kg) 208 lb 4.8 oz (94.5 kg) 206 lb 14.4 oz (93.8 kg)    ROS: Review of Systems  Constitutional: Negative for chills and fever.  Eyes: Negative for blurred vision.  Respiratory: Positive for cough and shortness of breath.   Cardiovascular: Negative for chest pain.  Gastrointestinal: Negative for abdominal pain, constipation, diarrhea, nausea and vomiting.  Genitourinary: Negative for dysuria.  Musculoskeletal: Negative for joint pain.  Neurological: Negative for dizziness and headaches.   Exam: Physical Exam  Constitutional: He is oriented to person, place, and time.  HENT:  Nose: No mucosal edema.  Mouth/Throat: No oropharyngeal exudate or posterior oropharyngeal edema.  Eyes: Conjunctivae, EOM and lids are normal. Pupils are equal, round, and reactive to light.  Neck: No JVD present. Carotid bruit is not present. No edema present. No thyroid mass and no thyromegaly present.  Cardiovascular: S1 normal and S2 normal.  An irregularly irregular rhythm present. Exam reveals no gallop.   Murmur heard.  Systolic murmur is present with a grade of 2/6  Pulses:      Dorsalis pedis pulses are 2+ on the right side, and 2+ on the left side.  Respiratory: No respiratory distress. He has no decreased breath sounds. He has no wheezes. He has no rhonchi. He has no rales.  GI: Soft. Bowel sounds are normal. There is no tenderness.  Musculoskeletal:       Right ankle: He exhibits swelling.       Left ankle: He  exhibits swelling.  Lymphadenopathy:    He has no cervical adenopathy.  Neurological: He is alert and oriented to person, place, and time. No cranial nerve deficit.  Skin: Skin is warm. No rash noted. Nails show no clubbing.  Psychiatric: He has a normal mood and affect.    Data Reviewed: Basic Metabolic Panel:  Recent Labs Lab 03/15/16 2003 03/16/16 [id number] 03/17/16 0551 03/18/16 0458 03/19/16 0540  NA 137 139 139 139 140  K 3.4* 3.5 3.3* 3.3* 3.8  CL 107 109 107 106 105  CO2 22 22 23 24 25   GLUCOSE 135* 115* 118* 114* 179*  BUN 27* 28* 24* 20 26*  CREATININE 1.32* 1.27* 1.19 1.12 1.05  CALCIUM 8.3* 8.2* 8.5* 8.3* 8.8*   Liver Function Tests:  Recent Labs Lab 03/15/16 2003  AST 33  ALT 22  ALKPHOS 175*  BILITOT 1.3*  PROT 6.5  ALBUMIN 3.2*   CBC:  Recent Labs Lab 03/15/16 2003 03/16/16 0627 03/17/16 0551 03/18/16 0458  WBC 13.9* 11.0* 8.9 9.2  NEUTROABS 12.6*  --   --   --   HGB 12.8* 12.2* 12.8* 13.2  HCT 38.4* 35.5* 37.6* 38.6*  MCV 93.7 93.5 93.4 92.1  PLT 129* 116* 139* 153   Cardiac Enzymes:  Recent Labs Lab 03/15/16 2003 03/16/16 0021 03/16/16 0627 03/16/16 0939  TROPONINI 0.39* 1.05* 1.05* 0.94*     Recent Results (from the past 240 hour(s))  Blood Culture (routine x 2)  Status: None (Preliminary result)   Collection Time: 03/15/16  8:03 PM  Result Value Ref Range Status   Specimen Description BLOOD  R WRIST  Final   Special Requests BOTTLES DRAWN AEROBIC AND ANAEROBIC ANA9 AER  Final   Culture NO GROWTH 4 DAYS  Final   Report Status PENDING  Incomplete  Urine culture     Status: Abnormal   Collection Time: 03/15/16  8:03 PM  Result Value Ref Range Status   Specimen Description URINE, RANDOM  Final   Special Requests NONE  Final   Culture >=100,000 COLONIES/mL ESCHERICHIA COLI (A)  Final   Report Status 03/18/2016 FINAL  Final   Organism ID, Bacteria ESCHERICHIA COLI (A)  Final      Susceptibility   Escherichia coli -  MIC*    AMPICILLIN 4 SENSITIVE Sensitive     CEFAZOLIN <=4 SENSITIVE Sensitive     CEFTRIAXONE <=1 SENSITIVE Sensitive     CIPROFLOXACIN <=0.25 SENSITIVE Sensitive     GENTAMICIN <=1 SENSITIVE Sensitive     IMIPENEM <=0.25 SENSITIVE Sensitive     NITROFURANTOIN <=16 SENSITIVE Sensitive     TRIMETH/SULFA <=20 SENSITIVE Sensitive     AMPICILLIN/SULBACTAM <=2 SENSITIVE Sensitive     PIP/TAZO <=4 SENSITIVE Sensitive     Extended ESBL NEGATIVE Sensitive     * >=100,000 COLONIES/mL ESCHERICHIA COLI  Blood Culture (routine x 2)     Status: None (Preliminary result)   Collection Time: 03/15/16  8:22 PM  Result Value Ref Range Status   Specimen Description BLOOD RIGHT AC  Final   Special Requests   Final    BOTTLES DRAWN AEROBIC AND ANAEROBIC AER ANA   Culture NO GROWTH 4 DAYS  Final   Report Status PENDING  Incomplete  MRSA PCR Screening     Status: None   Collection Time: 03/16/16  9:07 AM  Result Value Ref Range Status   MRSA by PCR NEGATIVE NEGATIVE Final    Comment:        The GeneXpert MRSA Assay (FDA approved for NASAL specimens only), is one component of a comprehensive MRSA colonization surveillance program. It is not intended to diagnose MRSA infection nor to guide or monitor treatment for MRSA infections.      Studies: Dg Chest 2 View  Result Date: 03/19/2016 CLINICAL DATA:  Shortness of breath. History of atrial fibrillation, CHF. EXAM: CHEST  2 VIEW COMPARISON:  Chest radiograph March 15, 2016 FINDINGS: Cardiac silhouette is mild to moderate enlarged unchanged. Status post median sternotomy for CABG. RIGHT cardiac defibrillator in situ. Similar chronic interstitial changes. Biapical pleural thickening. LEFT lung base pleural thickening. No pneumothorax. Osteopenia. Old LEFT humerus surgical neck fracture. Moderate degenerative change of the thoracic spine. IMPRESSION: Stable cardiomegaly and chronic interstitial changes. Extensive pleural thickening, no  definite pleural effusion. Electronically Signed   By: Awilda Metro M.D.   On: 03/19/2016 05:14    Scheduled Meds: . azithromycin  250 mg Oral Q24H  . cefTRIAXone  2 g Intravenous Q24H  . furosemide  40 mg Oral BID  . galantamine  4 mg Oral Daily  . guaiFENesin  600 mg Oral BID  . methylPREDNISolone (SOLU-MEDROL) injection  40 mg Intravenous Q12H  . metoprolol succinate  25 mg Oral Daily  . simvastatin  40 mg Oral q1800  . warfarin  3 mg Oral ONCE-1800  . Warfarin - Pharmacist Dosing Inpatient   Does not apply q1800    Assessment/Plan:  1.  Clinical sepsis  with bilateral pneumonia.  Continue iv antibiotic for today 2.   Acute cystitis with hematuria. So far urine cultures negative. treated 3.  Elevated troponin felt to be due to demand ischemia echo reviewed no significant changes compared to 2 years discuss with cardiology they do not feel patient had a non-ST MI 4. Chronic systolic congestive heart failure. Continue home medication of Lasix and metoprolol.   5. Chronic atrial fibrillation on anticoagulation with Coumadin.  pharmacy managing anticoagulation 6. Weakness physical therapy evaluation 7. Hyperlipidemia unspecified on simvastatin 8. Chronic kidney disease stage III.    Code Status:     Code Status Orders        Start     Ordered   03/15/16 2340  Full code  Continuous     03/15/16 2340    Code Status History    Date Active Date Inactive Code Status Order ID Comments User Context   12/10/2015  8:05 PM 12/13/2015 11:04 AM Full Code [id number]  Houston Siren, MD Inpatient   06/03/2015  4:38 AM 06/03/2015  3:03 PM Full Code [id number]  Enedina Finner, MD Inpatient      Disposition Plan: Rehabilitation tomorrow once insurance approves skill nursing facility  Consultants:  Cardiology  Antibiotics:  Rocephin  Zithromax  Time spent: 30 minutes  Seneca Hoback, [facility]  [employer]

## 2016-03-19 NOTE — Discharge Summary (Signed)
[facility] - [place] at [facility]  Vincent [name], 83 y.o., DOB 05-08-1919, MRN [id number]. Admission date: 03/15/2016 Discharge Date 03/19/2016 Primary MD Danella PentonMark F Miller, MD Admitting Physician [name], MD  Admission Diagnosis  Acute cystitis without hematuria [N30.00] Community acquired pneumonia, unspecified laterality [J18.9]  Discharge Diagnosis   Principal Problem:   Sepsis (HCC)   CAP (community acquired pneumonia)   Chronic systolic CHF (congestive heart failure) (HCC)   Atrial fibrillation (HCC)   CAD (coronary artery disease) with elevated troponin due to demand ischemia, patient did not have a non-ST MI   UTI (urinary tract infection)   Generalized weakness   Hypokalemia   Chronic kidney disease stage III        Hospital Course  Patient is a 83 year old white male who presented to the emergency room or malaise shortness of breath for 3-4 days in duration. Patient was noted to have pneumonia on chest x-ray was started on antibiotics. Patient improved with IV antibiotics chest x-ray also showed improvement. He was noted to have a troponin above 1 however patient had no chest pain and it was felt that the troponin elevation was due to demand ischemia. His echocardiogram showed no significant changes compared to previous echocardiogram. He was seen by cardiology. Patient is very weak and deconditioned and needed further rehabilitation. Which is being arranged. Also patient will need a follow-up BMP in a week to make sure his potassium is stable   Antabiotic duration: Azithromycin for 3 more days Ceftin for 5 more days         Consults  cardiology  Significant Tests:  See full reports for all details     Dg Chest 2 View  Result Date: 03/19/2016 CLINICAL DATA:  Shortness of breath. History of atrial fibrillation, CHF. EXAM: CHEST  2 VIEW COMPARISON:  Chest radiograph March 15, 2016 FINDINGS: Cardiac silhouette is mild to moderate enlarged  unchanged. Status post median sternotomy for CABG. RIGHT cardiac defibrillator in situ. Similar chronic interstitial changes. Biapical pleural thickening. LEFT lung base pleural thickening. No pneumothorax. Osteopenia. Old LEFT humerus surgical neck fracture. Moderate degenerative change of the thoracic spine. IMPRESSION: Stable cardiomegaly and chronic interstitial changes. Extensive pleural thickening, no definite pleural effusion. Electronically Signed   By: Awilda Metroourtnay  Bloomer M.D.   On: 03/19/2016 05:14   Dg Chest Port 1 View  Result Date: 03/15/2016 CLINICAL DATA:  Fever EXAM: PORTABLE CHEST 1 VIEW COMPARISON:  12/10/2015 chest radiograph. FINDINGS: Stable configuration of median sternotomy wires, CABG clips and 3 lead right subclavian ICD. Stable cardiomediastinal silhouette with mild cardiomegaly and aortic atherosclerosis. No pneumothorax. Small bilateral pleural effusions. Stable biapical pleural thickening. Patchy bibasilar lung opacities. IMPRESSION: Stable mild cardiomegaly. Patchy bibasilar lung opacities, suspicious for pneumonia or aspiration, with a component of mild pulmonary edema not excluded. Recommend attention on follow up chest imaging. Small bilateral pleural effusions. Aortic atherosclerosis. Electronically Signed   By: Delbert PhenixJason A Poff M.D.   On: 03/15/2016 20:20       Today   Subjective:   [name] feeling better continues to cough. No chest pains  Objective:   Blood pressure (!) 152/83, pulse 75, temperature 97.4 F (36.3 C), temperature source Oral, resp. rate 14, height 5\' 7"  (1.702 m), weight 206 lb 14.4 oz (93.8 kg), SpO2 100 %.  .  Intake/Output Summary (Last 24 hours) at 03/19/16 0902 Last data filed at 03/19/16 0832  Gross per 24 hour  Intake  480 ml  Output              825 ml  Net             -345 ml    Exam VITAL SIGNS: Blood pressure (!) 152/83, pulse 75, temperature 97.4 F (36.3 C), temperature source Oral, resp. rate 14, height 5'  7" (1.702 m), weight 206 lb 14.4 oz (93.8 kg), SpO2 100 %.  GENERAL:  83 y.o.-year-old patient lying in the bed with no acute distress.  EYES: Pupils equal, round, reactive to light and accommodation. No scleral icterus. Extraocular muscles intact.  HEENT: Head atraumatic, normocephalic. Oropharynx and nasopharynx clear.  NECK:  Supple, no jugular venous distention. No thyroid enlargement, no tenderness.  LUNGS: Normal breath sounds bilaterally, no wheezing, rales,rhonchi or crepitation. No use of accessory muscles of respiration.  CARDIOVASCULAR: S1, S2 normal. No murmurs, rubs, or gallops.  ABDOMEN: Soft, nontender, nondistended. Bowel sounds present. No organomegaly or mass.  EXTREMITIES: No pedal edema, cyanosis, or clubbing.  NEUROLOGIC: Cranial nerves II through XII are intact. Muscle strength 5/5 in all extremities. Sensation intact. Gait not checked.  PSYCHIATRIC: The patient is alert and oriented x 3.  SKIN: No obvious rash, lesion, or ulcer.   Data Review     CBC w Diff:  Lab Results  Component Value Date   WBC 9.2 03/18/2016   HGB 13.2 03/18/2016   HGB 14.6 07/16/2013   HCT 38.6 (L) 03/18/2016   HCT 43.4 07/16/2013   PLT 153 03/18/2016   PLT 151 07/16/2013   LYMPHOPCT 2 03/15/2016   MONOPCT 7 03/15/2016   MONOPCT 12 07/16/2013   EOSPCT 0 03/15/2016   BASOPCT 0 03/15/2016   CMP:  Lab Results  Component Value Date   NA 140 03/19/2016   NA 138 07/16/2013   K 3.8 03/19/2016   K 4.3 07/16/2013   CL 105 03/19/2016   CL 104 07/16/2013   CO2 25 03/19/2016   CO2 24 07/16/2013   BUN 26 (H) 03/19/2016   BUN 27 (H) 07/16/2013   CREATININE 1.05 03/19/2016   CREATININE 1.23 07/16/2013   PROT 6.5 03/15/2016   ALBUMIN 3.2 (L) 03/15/2016   BILITOT 1.3 (H) 03/15/2016   ALKPHOS 175 (H) 03/15/2016   AST 33 03/15/2016   ALT 22 03/15/2016  .  Micro Results Recent Results (from the past 240 hour(s))  Blood Culture (routine x 2)     Status: None (Preliminary result)    Collection Time: 03/15/16  8:03 PM  Result Value Ref Range Status   Specimen Description BLOOD  R WRIST  Final   Special Requests BOTTLES DRAWN AEROBIC AND ANAEROBIC ANA9 AER  Final   Culture NO GROWTH 4 DAYS  Final   Report Status PENDING  Incomplete  Urine culture     Status: Abnormal   Collection Time: 03/15/16  8:03 PM  Result Value Ref Range Status   Specimen Description URINE, RANDOM  Final   Special Requests NONE  Final   Culture >=100,000 COLONIES/mL ESCHERICHIA COLI (A)  Final   Report Status 03/18/2016 FINAL  Final   Organism ID, Bacteria ESCHERICHIA COLI (A)  Final      Susceptibility   Escherichia coli - MIC*    AMPICILLIN 4 SENSITIVE Sensitive     CEFAZOLIN <=4 SENSITIVE Sensitive     CEFTRIAXONE <=1 SENSITIVE Sensitive     CIPROFLOXACIN <=0.25 SENSITIVE Sensitive     GENTAMICIN <=1 SENSITIVE Sensitive     IMIPENEM <=0.25  SENSITIVE Sensitive     NITROFURANTOIN <=16 SENSITIVE Sensitive     TRIMETH/SULFA <=20 SENSITIVE Sensitive     AMPICILLIN/SULBACTAM <=2 SENSITIVE Sensitive     PIP/TAZO <=4 SENSITIVE Sensitive     Extended ESBL NEGATIVE Sensitive     * >=100,000 COLONIES/mL ESCHERICHIA COLI  Blood Culture (routine x 2)     Status: None (Preliminary result)   Collection Time: 03/15/16  8:22 PM  Result Value Ref Range Status   Specimen Description BLOOD RIGHT AC  Final   Special Requests   Final    BOTTLES DRAWN AEROBIC AND ANAEROBIC AER ANA   Culture NO GROWTH 4 DAYS  Final   Report Status PENDING  Incomplete  MRSA PCR Screening     Status: None   Collection Time: 03/16/16  9:07 AM  Result Value Ref Range Status   MRSA by PCR NEGATIVE NEGATIVE Final    Comment:        The GeneXpert MRSA Assay (FDA approved for NASAL specimens only), is one component of a comprehensive MRSA colonization surveillance program. It is not intended to diagnose MRSA infection nor to guide or monitor treatment for MRSA infections.         Code Status Orders         Start     Ordered   03/15/16 2340  Full code  Continuous     03/15/16 2340    Code Status History    Date Active Date Inactive Code Status Order ID Comments User Context   12/10/2015  8:05 PM 12/13/2015 11:04 AM Full Code [id number]  Houston Siren, MD Inpatient   06/03/2015  4:38 AM 06/03/2015  3:03 PM Full Code [id number]  Enedina Finner, MD Inpatient          Follow-up Information    Danella Penton, MD Follow up in 7 day(s).   Specialty:  Internal Medicine Contact information: [phone] [facility] [address] [facility] [place] [address] [phone]        Marcina Millard, MD Follow up in 7 day(s).   Specialty:  Cardiology Contact information: [address] [facility] [place] [address] [phone]           Discharge Medications   Allergies as of 03/19/2016      Reactions   Atorvastatin    Other reaction(s): Unknown   Codeine Nausea Only   Meperidine Nausea Only      Medication List    TAKE these medications   azithromycin 250 MG tablet Commonly known as:  ZITHROMAX One tablet po daily x 3 days   benzonatate 200 MG capsule Commonly known as:  TESSALON Take 1 capsule (200 mg total) by mouth 3 (three) times daily as needed for cough.   cefUROXime 500 MG tablet Commonly known as:  CEFTIN Take 1 tablet (500 mg total) by mouth 2 (two) times daily with a meal.   docusate sodium 100 MG capsule Commonly known as:  COLACE Take 1 capsule (100 mg total) by mouth 2 (two) times daily.   furosemide 40 MG tablet Commonly known as:  LASIX Take 1 tablet by mouth 2 (two) times daily.   galantamine 4 MG tablet Commonly known as:  RAZADYNE Take 1 tablet (4 mg total) by mouth daily.   guaiFENesin 600 MG 12 hr tablet Commonly known as:  MUCINEX Take 1 tablet (600 mg total) by mouth 2 (two) times daily.   guaiFENesin-dextromethorphan 100-10 MG/5ML syrup Commonly known as:  ROBITUSSIN DM Take 5 mLs by  mouth every 4 (four) hours as needed for cough.   metoprolol succinate 25 MG 24 hr tablet Commonly known as:  TOPROL-XL Take 1 tablet by mouth daily.   polyethylene glycol packet Commonly known as:  MIRALAX / GLYCOLAX Take 17 g by mouth daily as needed for mild constipation or moderate constipation.   potassium chloride 20 MEQ packet Commonly known as:  KLOR-CON Take 20 mEq by mouth daily.   simvastatin 40 MG tablet Commonly known as:  ZOCOR Take 1 tablet by mouth daily at 6 PM.   warfarin 4 MG tablet Commonly known as:  COUMADIN Take 1 tablet by mouth daily at 6 PM.          Total Time in preparing paper work, data evaluation and todays exam - 35 minutes  Auburn Bilberry M.D on 03/19/2016 at 9:02 AM  [facility]   Office  [phone]

## 2016-03-19 NOTE — Progress Notes (Signed)
Loss of IV access bilaterally. MD paged and notified. Per MD, ok to go without IV access since patient discharging tomorrow and patient is hard stick. PO antibiotics ordered to replace IV antibiotics.

## 2016-03-19 NOTE — Progress Notes (Signed)
[facility]  SUBJECTIVE: Feels better with less shortness of breath   Vitals:   03/18/16 1950 03/18/16 1954 03/19/16 0441 03/19/16 0500  BP: 133/75  (!) 152/83   Pulse:  75 75   Resp: 16  14   Temp: 98.4 F (36.9 C)  97.4 F (36.3 C)   TempSrc: Oral  Oral   SpO2:  98% 100%   Weight:    206 lb 14.4 oz (93.8 kg)  Height:        Intake/Output Summary (Last 24 hours) at 03/19/16 0908 Last data filed at 03/19/16 [id number]  Gross per 24 hour  Intake              480 ml  Output              825 ml  Net             -345 ml    LABS: Basic Metabolic Panel:  Recent Labs  11/91/4712/31/17 0458 03/19/16 0540  NA 139 140  K 3.3* 3.8  CL 106 105  CO2 24 25  GLUCOSE 114* 179*  BUN 20 26*  CREATININE 1.12 1.05  CALCIUM 8.3* 8.8*   Liver Function Tests: No results for input(s): AST, ALT, ALKPHOS, BILITOT, PROT, ALBUMIN in the last 72 hours. No results for input(s): LIPASE, AMYLASE in the last 72 hours. CBC:  Recent Labs  03/17/16 0551 03/18/16 0458  WBC 8.9 9.2  HGB 12.8* 13.2  HCT 37.6* 38.6*  MCV 93.4 92.1  PLT 139* 153   Cardiac Enzymes:  Recent Labs  03/16/16 0939  TROPONINI 0.94*   BNP: Invalid input(s): POCBNP D-Dimer: No results for input(s): DDIMER in the last 72 hours. Hemoglobin A1C: No results for input(s): HGBA1C in the last 72 hours. Fasting Lipid Panel: No results for input(s): CHOL, HDL, LDLCALC, TRIG, CHOLHDL, LDLDIRECT in the last 72 hours. Thyroid Function Tests: No results for input(s): TSH, T4TOTAL, T3FREE, THYROIDAB in the last 72 hours.  Invalid input(s): FREET3 Anemia Panel: No results for input(s): VITAMINB12, FOLATE, FERRITIN, TIBC, IRON, RETICCTPCT in the last 72 hours.   Physical Exam: Blood pressure (!) 152/83, pulse 75, temperature 97.4 F (36.3 C), temperature source Oral, resp. rate 14, height 5\' 7"  (1.702 m), weight 206 lb 14.4 oz (93.8 kg), SpO2 100 %.   Wt Readings from Last 1 Encounters:   03/19/16 206 lb 14.4 oz (93.8 kg)     General appearance: alert and cooperative Resp: rhonchi bibasilar Cardio: regular rate and rhythm GI: soft, non-tender; bowel sounds normal; no masses,  no organomegaly Extremities: extremities normal, atraumatic, no cyanosis or edema Neurologic: Grossly normal  TELEMETRY: Reviewed telemetry pt in ventricular paced rhythm:  ASSESSMENT AND PLAN:  Principal Problem:   Sepsis (HCC) Active Problems:   CAP (community acquired pneumonia) appears to be improved clinically and by chest x-ray.   Chronic systolic CHF (congestive heart failure) (HCC)   Atrial fibrillation (HCC)-currently stable with ventricular paced rhythm, INR is 2.54. Okay for discharge on current regimen warfarin anticoagulation. Up in 7-10 days with Dr. Darrold JunkerParaschos   CAD (coronary artery disease)   UTI (urinary tract infection)    Dalia HeadingKenneth A Charisa Twitty, MD, [facility]ACC 03/19/2016 9:08 AM

## 2016-03-19 NOTE — Discharge Instructions (Signed)
[facility] - [place] at [facility]  DIET:  Cardiac diet  DISCHARGE CONDITION:  Stable  ACTIVITY:  Activity as tolerated  OXYGEN:  Home Oxygen: yes   Oxygen Delivery: 2 liters/min via Patient connected to nasal cannula oxygen  DISCHARGE LOCATION:  nursing home    ADDITIONAL DISCHARGE INSTRUCTION:   If you experience worsening of your admission symptoms, develop shortness of breath, life threatening emergency, suicidal or homicidal thoughts you must seek medical attention immediately by calling 911 or calling your MD immediately  if symptoms less severe.  You Must read complete instructions/literature along with all the possible adverse reactions/side effects for all the Medicines you take and that have been prescribed to you. Take any new Medicines after you have completely understood and accpet all the possible adverse reactions/side effects.   Please note  You were cared for by a hospitalist during your hospital stay. If you have any questions about your discharge medications or the care you received while you were in the hospital after you are discharged, you can call the unit and asked to speak with the hospitalist on call if the hospitalist that took care of you is not available. Once you are discharged, your primary care physician will handle any further medical issues. Please note that NO REFILLS for any discharge medications will be authorized once you are discharged, as it is imperative that you return to your primary care physician (or establish a relationship with a primary care physician if you do not have one) for your aftercare needs so that they can reassess your need for medications and monitor your lab values.

## 2016-03-19 NOTE — Progress Notes (Signed)
ANTICOAGULATION CONSULT NOTE - Follow Up Pharmacy Consult for warfarin dosing Indication: atrial fibrillation  Allergies  Allergen Reactions  . Atorvastatin     Other reaction(s): Unknown  . Codeine Nausea Only  . Meperidine Nausea Only    Patient Measurements: Height: 5\' 7"  (170.2 cm) Weight: 206 lb 14.4 oz (93.8 kg) IBW/kg (Calculated) : 66.1  Vital Signs: Temp: 97.4 F (36.3 C) (01/01 0441) Temp Source: Oral (01/01 0441) BP: 147/79 (01/01 0913) Pulse Rate: 78 (01/01 0913)  Labs:  Recent Labs  03/17/16 0551 03/18/16 0458 03/19/16 0540  HGB 12.8* 13.2  --   HCT 37.6* 38.6*  --   PLT 139* 153  --   LABPROT 23.5* 25.0* 27.8*  INR 2.06 2.22 2.54  CREATININE 1.19 1.12 1.05    Estimated Creatinine Clearance: 47 mL/min (by C-G formula based on SCr of 1.05 mg/dL).   Medical History: Past Medical History:  Diagnosis Date  . AICD (automatic cardioverter/defibrillator) present   . Atrial fibrillation (HCC)   . CHF (congestive heart failure) (HCC)   . CKD (chronic kidney disease)   . Coronary artery disease   . GERD (gastroesophageal reflux disease)   . Presence of permanent cardiac pacemaker    Assessment:  Pharmacy consulted to dose and monitor warfarin in this 83 year old male who was taking warfarin 4 mg PO daily prior to admission for atrial fibrillation. Patient's INR was therapeutic on admission.  Patient currently on azithromycin which could enhance effects of warfarin.  Dosing history: Date INR Dose  12/28 2.21  12/29 -- 4 mg 12/30 2.06 4 mg 12/31 2.22 4 mg 1/1 2.54  Goal of Therapy:  INR 2-3  Plan:  INR remains therapeutic, but is increasing - possibly reflective of drug interaction between warfarin and azithromycin. Will give reduced dose of warfarin 3 mg once this evening. Azithromycin to continue through 1/3. Will recheck INR with AM labs tomorrow.  Anticipate patient will be able to resume home dose after completing antibiotics as INR was  therapeutic on admission.  Cindi CarbonMary M Chinonso Linker, PharmD, BCPS Clinical Pharmacist 03/19/2016,9:44 AM

## 2016-03-19 NOTE — Clinical Social Work Note (Addendum)
CSW spoke to patient and his son in law who was at bedside.  [facility] [place] however [employer] insurance company is closed today so unable to receive authorization and SNFs pharmacies are closed today, so they are not able to order medications.  CSW notified bedside nurse and physician, that patient is unable to discharge today.  CSW to continue to follow patient's progress throughout discharge planning.  4:15pm  [facility] once insurance has given approval.  CSW notified patient and his son to inform him [facility]  CSW to continue to follow patient's progress and facilitate discharge planning.  Ervin KnackEric R. Bonna Steury, MSW, LCSWA [phone]  Mon-Fri 8a-4:30p 03/19/2016 11:23 AM

## 2016-03-20 ENCOUNTER — Encounter
Admission: RE | Admit: 2016-03-20 | Discharge: 2016-03-20 | Disposition: A | Discharge status: Home / Self Care | Payer: Medicare PPO | Source: Ambulatory Visit | Attending: Internal Medicine | Admitting: Internal Medicine

## 2016-03-20 LAB — CULTURE, BLOOD (ROUTINE X 2)
CULTURE: NO GROWTH
CULTURE: NO GROWTH

## 2016-03-20 LAB — PROTIME-INR
INR: 3.41
PROTHROMBIN TIME: 35.2 s — AB (ref 11.4–15.2)

## 2016-03-20 MED ORDER — HALOPERIDOL LACTATE 5 MG/ML IJ SOLN
1.0000 mg | Freq: Once | INTRAMUSCULAR | Status: AC
  Administered 2016-03-20: 1 mg via INTRAMUSCULAR
  Filled 2016-03-20: qty 1

## 2016-03-20 MED ORDER — LORAZEPAM 0.5 MG PO TABS
0.5000 mg | ORAL_TABLET | Freq: Once | ORAL | Status: AC
  Administered 2016-03-20: 0.5 mg via ORAL

## 2016-03-20 NOTE — Progress Notes (Signed)
Physical Therapy Treatment Patient Details Name: Vincent [name] MRN: [id number] DOB: [birth year] Today's Date: 03/20/2016    History of Present Illness presented to ER secondary to fever and malaise, progressive SOB x3-4 days; admitted with sepsis related to UTI, acute cystitis and NSTEMI (troponin peak at 1.05, downtrending, demand ischemia per cards).    PT Comments    Pt showed increased gait distance and cadence but is very confused and ultimately shows little awareness and needed constant cuing both directional and safety to insure that he stayed at least somewhat on task during ambulation.  Pt did not have any LOBs with ambulation but did not feel safe trying to walk with his cane and generally speaking is completely unsafe to return home at this time.  Pt pleasant but confused and with poor overall awareness.   Follow Up Recommendations  SNF     Equipment Recommendations       Recommendations for Other Services       Precautions / Restrictions Precautions Precautions: Fall Restrictions Weight Bearing Restrictions: No    Mobility  Bed Mobility               General bed mobility comments: not tested, pt in recliner on arrival - out in hallway secondary to confusion and trying to get up on his own  Transfers Overall transfer level: Modified independent Equipment used: Rolling walker (2 wheeled) Transfers: Sit to/from Stand Sit to Stand: Min guard         General transfer comment: Pt generally weak and needed some assist and heavier cuing for safety and sequencing to get up to standing from sitting  Ambulation/Gait Ambulation/Gait assistance: Min guard Ambulation Distance (Feet): 300 Feet Assistive device: Rolling walker (2 wheeled)       General Gait Details: Pt confused the entire time in standing/walking and needs essentially constant cuing and directional assist.  He did not have any LOBs but was very unsafe, impulsive and generally did not show good  awareness.  He displayed better cadence and tolerance, but was unsafe and needed constant cuing.  Discussed walking with his cane (like baseline) and pt did not wish to try stating he did not feel very steady even with the walker.    Stairs            Wheelchair Mobility    Modified Rankin (Stroke Patients Only)       Balance Overall balance assessment: Modified Independent   Sitting balance-Leahy Scale: Good     Standing balance support: Bilateral upper extremity supported Standing balance-Leahy Scale: Good                      Cognition Arousal/Alertness:  (Pt awake, not at all situationally aware) Behavior During Therapy: Impulsive Overall Cognitive Status: Difficult to assess                 General Comments: Pt very confused, able to follow commands, but quickly off topic and off     Exercises      General Comments        Pertinent Vitals/Pain Pain Assessment: No/denies pain    Home Living                      Prior Function            PT Goals (current goals can now be found in the care plan section) Progress towards PT goals: Progressing toward goals    Frequency  Min 2X/week      PT Plan Current plan remains appropriate    Co-evaluation             End of Session Equipment Utilized During Treatment: Gait belt Activity Tolerance: Patient tolerated treatment well Patient left:  (seated in hallway with sitter observing)     Time: [id number] PT Time Calculation (min) (ACUTE ONLY): 14 min  Charges:  $Gait Training: 8-22 mins                    G Codes:      Vincent [name] Justyna Timoney, DPT 03/20/2016, 10:08 AM

## 2016-03-20 NOTE — Care Management (Signed)
Spoke with physical therapy this morning.  Patient ambulated over 300 feet with walker and hand held contact with safety belt due to his impulsiveness and confusion. If returns home patient will require 24/7 round the clock supervision.  CM informed by staff that patient has become increasingly confused. Do not see a diagnosis of dementia in medical record.

## 2016-03-20 NOTE — Discharge Summary (Addendum)
[facility] [place] [facility], 83 y.o., DOB [birth year], MRN [id number]. Admission date: 03/15/2016 Discharge Date 03/20/2016 Primary MD Danella Penton, MD Admitting Physician Oralia Manis, MD  Admission Diagnosis  Acute cystitis without hematuria [N30.00] Community acquired pneumonia, unspecified laterality [J18.9]  Discharge Diagnosis   Principal Problem:   Sepsis (HCC)   Acute delirium due to pneumonia   CAP (community acquired pneumonia)   Chronic systolic CHF (congestive heart failure) (HCC)   Atrial fibrillation (HCC)   CAD (coronary artery disease) with elevated troponin due to demand ischemia, patient did not have a non-ST MI   UTI (urinary tract infection)   Generalized weakness   Hypokalemia   Chronic kidney disease stage III        Hospital Course  Patient is a 83 year old white male who presented to the emergency room or malaise shortness of breath for 3-4 days in duration. Patient was noted to have pneumonia on chest x-ray was started on antibiotics. Patient improved with IV antibiotics chest x-ray also showed improvement. He was noted to have a troponin above 1 however patient had no chest pain and it was felt that the troponin elevation was due to demand ischemia. His echocardiogram showed no significant changes compared to previous echocardiogram. He was seen by cardiology. Patient is very weak and deconditioned and needed further rehabilitation. Which is being arranged.patient has had some confusion due to acute delirium Also patient will need a follow-up BMP in a week to make sure his potassium is stable   Antabiotic duration: Azithromycin for 3 more days Ceftin for 5 more days   dont give coumadin tonight  Check PT in 2 days       Consults  cardiology  Significant Tests:  See full reports for all details     Dg Chest 2 View  Result Date: 03/19/2016 CLINICAL DATA:  Shortness of breath. History of atrial  fibrillation, CHF. EXAM: CHEST  2 VIEW COMPARISON:  Chest radiograph March 15, 2016 FINDINGS: Cardiac silhouette is mild to moderate enlarged unchanged. Status post median sternotomy for CABG. RIGHT cardiac defibrillator in situ. Similar chronic interstitial changes. Biapical pleural thickening. LEFT lung base pleural thickening. No pneumothorax. Osteopenia. Old LEFT humerus surgical neck fracture. Moderate degenerative change of the thoracic spine. IMPRESSION: Stable cardiomegaly and chronic interstitial changes. Extensive pleural thickening, no definite pleural effusion. Electronically Signed   By: Awilda Metro M.D.   On: 03/19/2016 05:14   Dg Chest Port 1 View  Result Date: 03/15/2016 CLINICAL DATA:  Fever EXAM: PORTABLE CHEST 1 VIEW COMPARISON:  12/10/2015 chest radiograph. FINDINGS: Stable configuration of median sternotomy wires, CABG clips and 3 lead right subclavian ICD. Stable cardiomediastinal silhouette with mild cardiomegaly and aortic atherosclerosis. No pneumothorax. Small bilateral pleural effusions. Stable biapical pleural thickening. Patchy bibasilar lung opacities. IMPRESSION: Stable mild cardiomegaly. Patchy bibasilar lung opacities, suspicious for pneumonia or aspiration, with a component of mild pulmonary edema not excluded. Recommend attention on follow up chest imaging. Small bilateral pleural effusions. Aortic atherosclerosis. Electronically Signed   By: Delbert Phenix M.D.   On: 03/15/2016 20:20       Today   Subjective:   Vincent [name] feeling better continues to cough. No chest pains  Objective:   Blood pressure (!) 143/79, pulse 79, temperature 97.6 F (36.4 C), temperature source Oral, resp. rate 16, height 5\' 7"  (1.702 m), weight 208 lb 6.4 oz (94.5 kg), SpO2 91 %.  .  Intake/Output Summary (Last 24 hours)  at 03/20/16 0914 Last data filed at 03/20/16 0654  Gross per 24 hour  Intake              960 ml  Output              851 ml  Net              109 ml     Exam VITAL SIGNS: Blood pressure (!) 143/79, pulse 79, temperature 97.6 F (36.4 C), temperature source Oral, resp. rate 16, height 5\' 7"  (1.702 m), weight 208 lb 6.4 oz (94.5 kg), SpO2 91 %.  GENERAL:  83 y.o.-year-old patient lying in the bed with no acute distress.  EYES: Pupils equal, round, reactive to light and accommodation. No scleral icterus. Extraocular muscles intact.  HEENT: Head atraumatic, normocephalic. Oropharynx and nasopharynx clear.  NECK:  Supple, no jugular venous distention. No thyroid enlargement, no tenderness.  LUNGS: Normal breath sounds bilaterally, no wheezing, rales,rhonchi or crepitation. No use of accessory muscles of respiration.  CARDIOVASCULAR: S1, S2 normal. No murmurs, rubs, or gallops.  ABDOMEN: Soft, nontender, nondistended. Bowel sounds present. No organomegaly or mass.  EXTREMITIES: No pedal edema, cyanosis, or clubbing.  NEUROLOGIC: Cranial nerves II through XII are intact. Muscle strength 5/5 in all extremities. Sensation intact. Gait not checked.  PSYCHIATRIC: patient is awake but is confused to person and place SKIN: No obvious rash, lesion, or ulcer.   Data Review     CBC w Diff:  Lab Results  Component Value Date   WBC 9.2 03/18/2016   HGB 13.2 03/18/2016   HGB 14.6 07/16/2013   HCT 38.6 (L) 03/18/2016   HCT 43.4 07/16/2013   PLT 153 03/18/2016   PLT 151 07/16/2013   LYMPHOPCT 2 03/15/2016   MONOPCT 7 03/15/2016   MONOPCT 12 07/16/2013   EOSPCT 0 03/15/2016   BASOPCT 0 03/15/2016   CMP:  Lab Results  Component Value Date   NA 140 03/19/2016   NA 138 07/16/2013   K 3.8 03/19/2016   K 4.3 07/16/2013   CL 105 03/19/2016   CL 104 07/16/2013   CO2 25 03/19/2016   CO2 24 07/16/2013   BUN 26 (H) 03/19/2016   BUN 27 (H) 07/16/2013   CREATININE 1.05 03/19/2016   CREATININE 1.23 07/16/2013   PROT 6.5 03/15/2016   ALBUMIN 3.2 (L) 03/15/2016   BILITOT 1.3 (H) 03/15/2016   ALKPHOS 175 (H) 03/15/2016   AST 33 03/15/2016    ALT 22 03/15/2016  .  Micro Results Recent Results (from the past 240 hour(s))  Blood Culture (routine x 2)     Status: None   Collection Time: 03/15/16  8:03 PM  Result Value Ref Range Status   Specimen Description BLOOD  R WRIST  Final   Special Requests BOTTLES DRAWN AEROBIC AND ANAEROBIC ANA9 AER  Final   Culture NO GROWTH 5 DAYS  Final   Report Status 03/20/2016 FINAL  Final  Urine culture     Status: Abnormal   Collection Time: 03/15/16  8:03 PM  Result Value Ref Range Status   Specimen Description URINE, RANDOM  Final   Special Requests NONE  Final   Culture >=100,000 COLONIES/mL ESCHERICHIA COLI (A)  Final   Report Status 03/18/2016 FINAL  Final   Organism ID, Bacteria ESCHERICHIA COLI (A)  Final      Susceptibility   Escherichia coli - MIC*    AMPICILLIN 4 SENSITIVE Sensitive     CEFAZOLIN <=4 SENSITIVE Sensitive  CEFTRIAXONE <=1 SENSITIVE Sensitive     CIPROFLOXACIN <=0.25 SENSITIVE Sensitive     GENTAMICIN <=1 SENSITIVE Sensitive     IMIPENEM <=0.25 SENSITIVE Sensitive     NITROFURANTOIN <=16 SENSITIVE Sensitive     TRIMETH/SULFA <=20 SENSITIVE Sensitive     AMPICILLIN/SULBACTAM <=2 SENSITIVE Sensitive     PIP/TAZO <=4 SENSITIVE Sensitive     Extended ESBL NEGATIVE Sensitive     * >=100,000 COLONIES/mL ESCHERICHIA COLI  Blood Culture (routine x 2)     Status: None   Collection Time: 03/15/16  8:22 PM  Result Value Ref Range Status   Specimen Description BLOOD RIGHT AC  Final   Special Requests   Final    BOTTLES DRAWN AEROBIC AND ANAEROBIC AER 10ML ANA 9ML   Culture NO GROWTH 5 DAYS  Final   Report Status 03/20/2016 FINAL  Final  MRSA PCR Screening     Status: None   Collection Time: 03/16/16  9:07 AM  Result Value Ref Range Status   MRSA by PCR NEGATIVE NEGATIVE Final    Comment:        The GeneXpert MRSA Assay (FDA approved for NASAL specimens only), is one component of a comprehensive MRSA colonization surveillance program. It is not intended  to diagnose MRSA infection nor to guide or monitor treatment for MRSA infections.         Code Status Orders        Start     Ordered   03/15/16 2340  Full code  Continuous     03/15/16 2340    Code Status History    Date Active Date Inactive Code Status Order ID Comments User Context   12/10/2015  8:05 PM 12/13/2015 11:04 AM Full Code [id number]  Houston SirenVivek J Sainani, MD Inpatient   06/03/2015  4:38 AM 06/03/2015  3:03 PM Full Code [id number]  Enedina FinnerSona Cuahutemoc Attar, MD Inpatient           Contact information for follow-up providers    Danella PentonMark F Miller, MD. Schedule an appointment as soon as possible for a visit in 7 days.   Specialty:  Internal Medicine Contact information: [phone] [facility] [address] [facility] [place] [address] [phone]        Marcina MillardAlexander Paraschos, MD. Schedule an appointment as soon as possible for a visit in 7 days.   Specialty:  Cardiology Contact information: [address] [facility] [place] [address] [phone]            Contact information for after-discharge care    Destination    [facility] .   Specialty:  Skilled Nursing Facility Contact information: [address] [place] [id number] [phone]                  Discharge Medications   Allergies as of 03/20/2016      Reactions   Atorvastatin    Other reaction(s): Unknown   Codeine Nausea Only   Meperidine Nausea Only      Medication List    TAKE these medications   azithromycin 250 MG tablet Commonly known as:  ZITHROMAX One tablet po daily x 3 days   benzonatate 200 MG capsule Commonly known as:  TESSALON Take 1 capsule (200 mg total) by mouth 3 (three) times daily as needed for cough.   cefUROXime 500 MG tablet Commonly known as:  CEFTIN Take 1 tablet (500 mg total) by mouth 2 (two) times daily with a meal.  docusate sodium 100 MG capsule Commonly known as:  COLACE Take 1  capsule (100 mg total) by mouth 2 (two) times daily.   furosemide 40 MG tablet Commonly known as:  LASIX Take 1 tablet by mouth 2 (two) times daily.   galantamine 4 MG tablet Commonly known as:  RAZADYNE Take 1 tablet (4 mg total) by mouth daily.   guaiFENesin 600 MG 12 hr tablet Commonly known as:  MUCINEX Take 1 tablet (600 mg total) by mouth 2 (two) times daily.   guaiFENesin-dextromethorphan 100-10 MG/5ML syrup Commonly known as:  ROBITUSSIN DM Take 5 mLs by mouth every 4 (four) hours as needed for cough.   metoprolol succinate 25 MG 24 hr tablet Commonly known as:  TOPROL-XL Take 1 tablet by mouth daily.   polyethylene glycol packet Commonly known as:  MIRALAX / GLYCOLAX Take 17 g by mouth daily as needed for mild constipation or moderate constipation.   potassium chloride 20 MEQ packet Commonly known as:  KLOR-CON Take 20 mEq by mouth daily.   simvastatin 40 MG tablet Commonly known as:  ZOCOR Take 1 tablet by mouth daily at 6 PM.   warfarin 4 MG tablet Commonly known as:  COUMADIN Take 1 tablet by mouth daily at 6 PM.          Total Time in preparing paper work, data evaluation and todays exam - 35 minutes  Auburn Bilberry M.D on 03/20/2016 at 9:14 AM  [facility]   Office  [phone]

## 2016-03-20 NOTE — Progress Notes (Signed)
Report called to [facility]. No IV or telemetry currently. Patient in room with some confusion at times. No distress at the moment. Son will be transporting patient to [facility].

## 2016-03-20 NOTE — Progress Notes (Signed)
ANTICOAGULATION CONSULT NOTE - Follow Up Pharmacy Consult for warfarin dosing Indication: atrial fibrillation  Allergies  Allergen Reactions  . Atorvastatin     Other reaction(s): Unknown  . Codeine Nausea Only  . Meperidine Nausea Only    Patient Measurements: Height: 5\' 7"  (170.2 cm) Weight: 208 lb 6.4 oz (94.5 kg) IBW/kg (Calculated) : 66.1  Vital Signs: Temp: 97.6 F (36.4 C) (01/02 0807) Temp Source: Oral (01/02 0807) BP: 143/79 (01/02 0807) Pulse Rate: 79 (01/02 0807)  Labs:  Recent Labs  03/18/16 0458 03/19/16 0540 03/20/16 0453  HGB 13.2  --   --   HCT 38.6*  --   --   PLT 153  --   --   LABPROT 25.0* 27.8* 35.2*  INR 2.22 2.54 3.41  CREATININE 1.12 1.05  --     Estimated Creatinine Clearance: 47.2 mL/min (by C-G formula based on SCr of 1.05 mg/dL).   Medical History: Past Medical History:  Diagnosis Date  . AICD (automatic cardioverter/defibrillator) present   . Atrial fibrillation (HCC)   . CHF (congestive heart failure) (HCC)   . CKD (chronic kidney disease)   . Coronary artery disease   . GERD (gastroesophageal reflux disease)   . Presence of permanent cardiac pacemaker    Assessment:  Pharmacy consulted to dose and monitor warfarin in this 83 year old male who was taking warfarin 4 mg PO daily prior to admission for atrial fibrillation. Patient's INR was therapeutic on admission.  Patient currently on azithromycin which could enhance effects of warfarin.  Dosing history: Date INR Dose  12/28 2.21  12/29 -- 4 mg 12/30 2.06 4 mg 12/31 2.22 4 mg 1/1 2.54     3 mg 1/2       3.41   Goal of Therapy:  INR 2-3  Plan:  INR supratherapeutic today - likely reflective of drug interaction between warfarin and azithromycin. Will hold warfarin for tonight. Will recheck INR with AM labs tomorrow. Pt with d/c orders to SNF. Called RN and informed her pt should not get warfarin dose tonight, said she would inform facility when giving report. No s/sx  of bleeding noted per RN as well.   Anticipate patient will be able to resume home dose after completing antibiotics as INR was therapeutic on admission.  Marty HeckWang, Raeonna Milo L, PharmD, BCPS Clinical Pharmacist 03/20/2016,1:56 PM

## 2016-03-20 NOTE — Progress Notes (Signed)
Patient experiencing increased confusion and agitation. Attempted to divert attention with distractions and was unsuccessful. MD notified. 1mg  IM haldol ordered. Will give and continue to monitor.

## 2016-03-20 NOTE — Progress Notes (Signed)
Patient is confused. Up with assist but is very unsteady. Patient became increasingly confused and agitated during the night, wanting to get out of bed to look for his clothes and wallet so he could leave. Also pulling O2 and telemetry off. O2 left off as oxygen Sats were in the high 90s. Telemetry placed on back. Will continue to monitor and redirect.

## 2016-03-20 NOTE — Care Management Important Message (Signed)
Important Message  Patient Details  Name: Vincent [name] MRN: [id number] Date of Birth: 02-Jun-1919   Medicare Important Message Given:  Yes  Initial signed IM printed from Epic and given to patient.     Eber HongGreene, Octavia Mottola R, RN 03/20/2016, 9:12 AM

## 2016-03-20 NOTE — Clinical Social Work Note (Signed)
Patient to be d/c'ed today to [facility] [place].  Patient and family agreeable to plans will transport via son in law's car RN to call report to [phone] room 707.  Windell MouldingEric Lyrik Buresh, MSW, LCSWA Mon-Fri 8a-4:30p [phone]

## 2016-03-20 NOTE — Progress Notes (Signed)
Patient ID: Vincent [name], male   DOB: [birth year], 83 y.o.   MRN: [id number]  [facility] PROGRESS NOTE  Vincent [name] WUJ:[id number] DOB: [birth year] DOA: 03/15/2016 PCP: Danella PentonMark F Miller, MD  HPI/Subjective: feeling better only on 1 L oxygen  Objective: Vitals:   03/20/16 0515 03/20/16 0807  BP: (!) 160/78 (!) 143/79  Pulse: 81 79  Resp: 18 16  Temp: 98.6 F (37 C) 97.6 F (36.4 C)    Filed Weights   03/18/16 0336 03/19/16 0500 03/20/16 0515  Weight: 208 lb 4.8 oz (94.5 kg) 206 lb 14.4 oz (93.8 kg) 208 lb 6.4 oz (94.5 kg)    ROS: Review of Systems  Constitutional: Negative for chills and fever.  Eyes: Negative for blurred vision.  Respiratory: Positive for cough and shortness of breath.   Cardiovascular: Negative for chest pain.  Gastrointestinal: Negative for abdominal pain, constipation, diarrhea, nausea and vomiting.  Genitourinary: Negative for dysuria.  Musculoskeletal: Negative for joint pain.  Neurological: Negative for dizziness and headaches.   Exam: Physical Exam  Constitutional: He is oriented to person, place, and time.  HENT:  Nose: No mucosal edema.  Mouth/Throat: No oropharyngeal exudate or posterior oropharyngeal edema.  Eyes: Conjunctivae, EOM and lids are normal. Pupils are equal, round, and reactive to light.  Neck: No JVD present. Carotid bruit is not present. No edema present. No thyroid mass and no thyromegaly present.  Cardiovascular: S1 normal and S2 normal.  An irregularly irregular rhythm present. Exam reveals no gallop.   Murmur heard.  Systolic murmur is present with a grade of 2/6  Pulses:      Dorsalis pedis pulses are 2+ on the right side, and 2+ on the left side.  Respiratory: No respiratory distress. He has no decreased breath sounds. He has no wheezes. He has no rhonchi. He has no rales.  GI: Soft. Bowel sounds are normal. There is no tenderness.  Musculoskeletal:       Right ankle: He exhibits swelling.       Left ankle:  He exhibits swelling.  Lymphadenopathy:    He has no cervical adenopathy.  Neurological: He is alert and oriented to person, place, and time. No cranial nerve deficit.  Skin: Skin is warm. No rash noted. Nails show no clubbing.  Psychiatric: He has a normal mood and affect.      Data Reviewed: Basic Metabolic Panel:  Recent Labs Lab 03/15/16 2003 03/16/16 [id number] 03/17/16 0551 03/18/16 0458 03/19/16 0540  NA 137 139 139 139 140  K 3.4* 3.5 3.3* 3.3* 3.8  CL 107 109 107 106 105  CO2 22 22 23 24 25   GLUCOSE 135* 115* 118* 114* 179*  BUN 27* 28* 24* 20 26*  CREATININE 1.32* 1.27* 1.19 1.12 1.05  CALCIUM 8.3* 8.2* 8.5* 8.3* 8.8*   Liver Function Tests:  Recent Labs Lab 03/15/16 2003  AST 33  ALT 22  ALKPHOS 175*  BILITOT 1.3*  PROT 6.5  ALBUMIN 3.2*   CBC:  Recent Labs Lab 03/15/16 2003 03/16/16 0627 03/17/16 0551 03/18/16 0458  WBC 13.9* 11.0* 8.9 9.2  NEUTROABS 12.6*  --   --   --   HGB 12.8* 12.2* 12.8* 13.2  HCT 38.4* 35.5* 37.6* 38.6*  MCV 93.7 93.5 93.4 92.1  PLT 129* 116* 139* 153   Cardiac Enzymes:  Recent Labs Lab 03/15/16 2003 03/16/16 0021 03/16/16 0627 03/16/16 0939  TROPONINI 0.39* 1.05* 1.05* 0.94*     Recent Results (from the past 240 hour(s))  Blood Culture (routine x 2)     Status: None   Collection Time: 03/15/16  8:03 PM  Result Value Ref Range Status   Specimen Description BLOOD  R WRIST  Final   Special Requests BOTTLES DRAWN AEROBIC AND ANAEROBIC ANA9 AER  Final   Culture NO GROWTH 5 DAYS  Final   Report Status 03/20/2016 FINAL  Final  Urine culture     Status: Abnormal   Collection Time: 03/15/16  8:03 PM  Result Value Ref Range Status   Specimen Description URINE, RANDOM  Final   Special Requests NONE  Final   Culture >=100,000 COLONIES/mL ESCHERICHIA COLI (A)  Final   Report Status 03/18/2016 FINAL  Final   Organism ID, Bacteria ESCHERICHIA COLI (A)  Final      Susceptibility   Escherichia coli - MIC*     AMPICILLIN 4 SENSITIVE Sensitive     CEFAZOLIN <=4 SENSITIVE Sensitive     CEFTRIAXONE <=1 SENSITIVE Sensitive     CIPROFLOXACIN <=0.25 SENSITIVE Sensitive     GENTAMICIN <=1 SENSITIVE Sensitive     IMIPENEM <=0.25 SENSITIVE Sensitive     NITROFURANTOIN <=16 SENSITIVE Sensitive     TRIMETH/SULFA <=20 SENSITIVE Sensitive     AMPICILLIN/SULBACTAM <=2 SENSITIVE Sensitive     PIP/TAZO <=4 SENSITIVE Sensitive     Extended ESBL NEGATIVE Sensitive     * >=100,000 COLONIES/mL ESCHERICHIA COLI  Blood Culture (routine x 2)     Status: None   Collection Time: 03/15/16  8:22 PM  Result Value Ref Range Status   Specimen Description BLOOD RIGHT AC  Final   Special Requests   Final    BOTTLES DRAWN AEROBIC AND ANAEROBIC AER ANA   Culture NO GROWTH 5 DAYS  Final   Report Status 03/20/2016 FINAL  Final  MRSA PCR Screening     Status: None   Collection Time: 03/16/16  9:07 AM  Result Value Ref Range Status   MRSA by PCR NEGATIVE NEGATIVE Final    Comment:        The GeneXpert MRSA Assay (FDA approved for NASAL specimens only), is one component of a comprehensive MRSA colonization surveillance program. It is not intended to diagnose MRSA infection nor to guide or monitor treatment for MRSA infections.      Studies: Dg Chest 2 View  Result Date: 03/19/2016 CLINICAL DATA:  Shortness of breath. History of atrial fibrillation, CHF. EXAM: CHEST  2 VIEW COMPARISON:  Chest radiograph March 15, 2016 FINDINGS: Cardiac silhouette is mild to moderate enlarged unchanged. Status post median sternotomy for CABG. RIGHT cardiac defibrillator in situ. Similar chronic interstitial changes. Biapical pleural thickening. LEFT lung base pleural thickening. No pneumothorax. Osteopenia. Old LEFT humerus surgical neck fracture. Moderate degenerative change of the thoracic spine. IMPRESSION: Stable cardiomegaly and chronic interstitial changes. Extensive pleural thickening, no definite pleural effusion.  Electronically Signed   By: Awilda Metro M.D.   On: 03/19/2016 05:14    Scheduled Meds: . azithromycin  250 mg Oral Q24H  . cefUROXime  500 mg Oral BID WC  . furosemide  40 mg Oral BID  . galantamine  4 mg Oral Daily  . guaiFENesin  600 mg Oral BID  . methylPREDNISolone (SOLU-MEDROL) injection  40 mg Intravenous Q12H  . metoprolol succinate  25 mg Oral Daily  . simvastatin  40 mg Oral q1800  . Warfarin - Pharmacist Dosing Inpatient   Does not apply q1800    Assessment/Plan:  1.  Clinical sepsis  with bilateral pneumonia.  Improved continue antibiotics 2.   Acute cystitis with hematuria. So far urine cultures negative. Rocephin would cover. 3.  Elevated trop due to demand ischemia nstemi rulled out 4. Chronic systolic congestive heart failure. Continue home medication of Lasix and metoprolol.   5. Chronic atrial fibrillation on anticoagulation with Coumadin.  I pharmacy managing anticoagulation 6. Weakness physical therapy evaluation 7. Hyperlipidemia unspecified on simvastatin 8. Chronic kidney disease stage III. Watch with diuresis.  Code Status:     Code Status Orders        Start     Ordered   03/15/16 2340  Full code  Continuous     03/15/16 2340    Code Status History    Date Active Date Inactive Code Status Order ID Comments User Context   12/10/2015  8:05 PM 12/13/2015 11:04 AM Full Code [id number]  Houston Siren, MD Inpatient   06/03/2015  4:38 AM 06/03/2015  3:03 PM Full Code [id number]  Enedina Finner, MD Inpatient      Disposition Plan: To be determined  Consultants:  Cardiology  Antibiotics:  Rocephin  Zithromax  Time spent: 30 minutes  Ollie Delano, [facility]  [facility]

## 2016-03-20 NOTE — Progress Notes (Signed)
Patient confused and keeps asking for clothes that he left in another room, trying to get up and go look for them, getting very anxious and wanting to leave. MD aware. Second dose of 0.5 PO ativan ordered and given. Will continue to monitor. Patient placed on chair alarm.

## 2016-03-21 LAB — PROCALCITONIN: Procalcitonin: 3.24 ng/mL

## 2017-04-16 IMAGING — DX DG CHEST 1V PORT
1 series · 2 of 2 positions shown · non-contrast
Comparison: 04/29/2006

CLINICAL DATA: Chest pain

EXAM:
PORTABLE CHEST 1 VIEW

[Series 1: chest ap · 0.14mm/px · 2 of 2 slices shown]
[im 1/2]
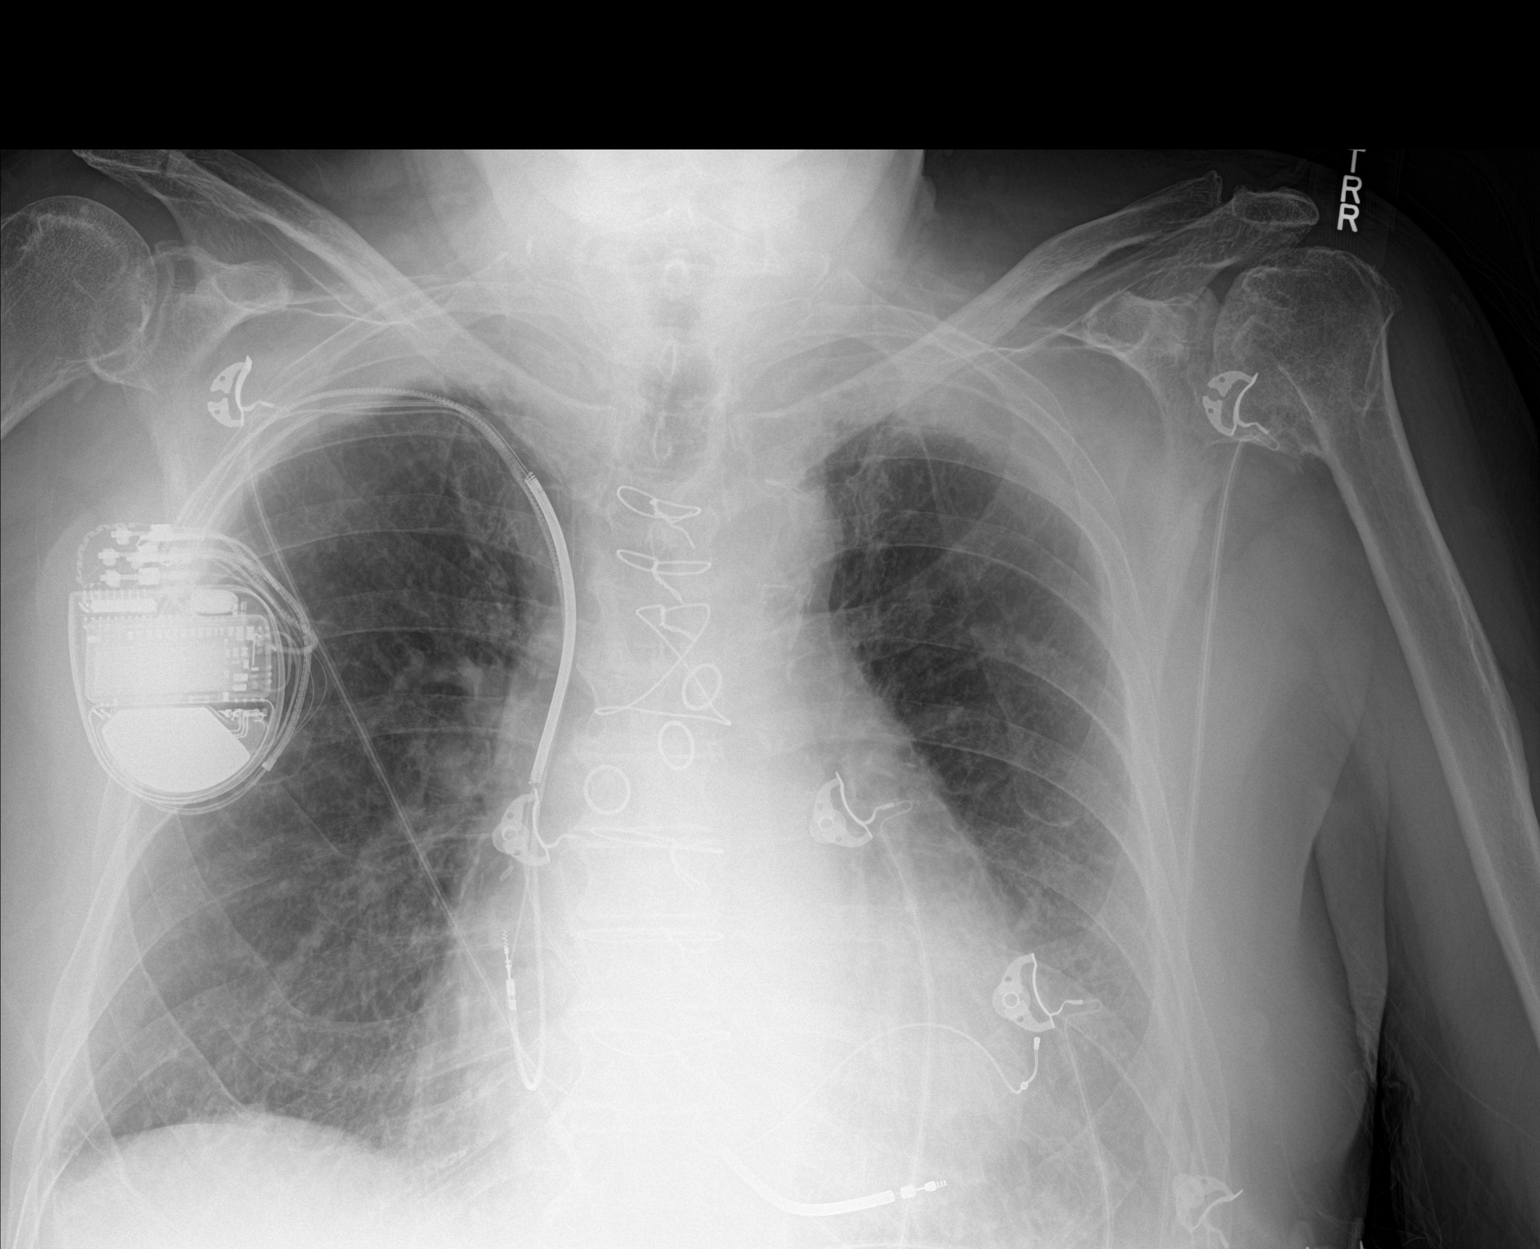
[im 2/2]
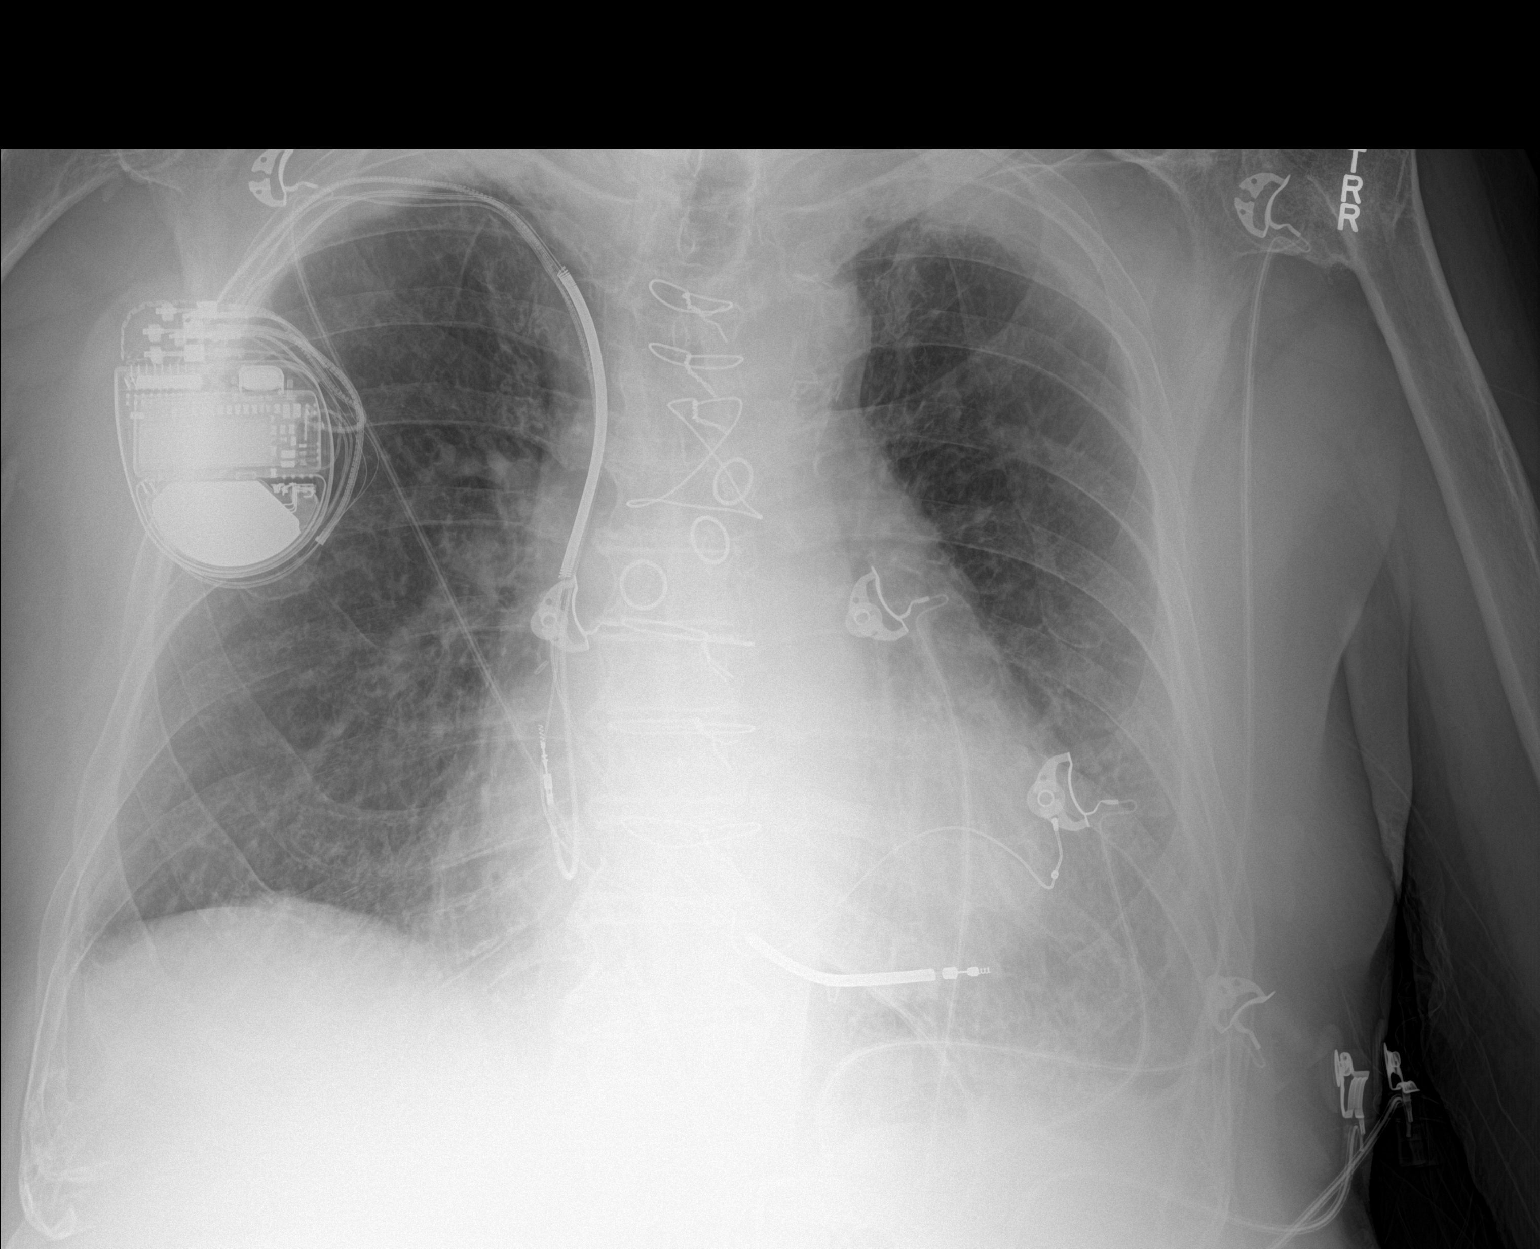

[2 of 2 positions shown; findings below may reference images not displayed]

FINDINGS: Biventricular ICD/ pacer from the right has been placed. There is
chronic cardiomegaly with changes of CABG. Hazy appearance at the
left base with costophrenic sulcus blunting, scarring given
stability. There is prominent biapical pleural thickening, symmetric
and chronic. There is no edema, consolidation, effusion, or
pneumothorax.
IMPRESSION: 1. No acute finding.
2. Chronic pleural thickening/ scarring on the left more than right.
3. Chronic cardiomegaly.

## 2017-07-21 ENCOUNTER — Emergency Department: Payer: Medicare PPO

## 2017-07-21 ENCOUNTER — Encounter: Payer: Self-pay | Admitting: Emergency Medicine

## 2017-07-21 ENCOUNTER — Emergency Department
Admission: EM | Admit: 2017-07-21 | Discharge: 2017-07-21 | Disposition: A | Discharge status: Skilled Nursing Facility | Payer: Medicare PPO | Attending: Student in an Organized Health Care Education/Training Program | Admitting: Student in an Organized Health Care Education/Training Program

## 2017-07-21 ENCOUNTER — Other Ambulatory Visit: Payer: Self-pay

## 2017-07-21 DIAGNOSIS — I509 Heart failure, unspecified: Secondary | ICD-10-CM | POA: Diagnosis not present

## 2017-07-21 DIAGNOSIS — N3001 Acute cystitis with hematuria: Secondary | ICD-10-CM | POA: Diagnosis not present

## 2017-07-21 DIAGNOSIS — Z7901 Long term (current) use of anticoagulants: Secondary | ICD-10-CM | POA: Diagnosis not present

## 2017-07-21 DIAGNOSIS — F0390 Unspecified dementia without behavioral disturbance: Secondary | ICD-10-CM | POA: Insufficient documentation

## 2017-07-21 DIAGNOSIS — Z79899 Other long term (current) drug therapy: Secondary | ICD-10-CM | POA: Insufficient documentation

## 2017-07-21 DIAGNOSIS — R55 Syncope and collapse: Secondary | ICD-10-CM

## 2017-07-21 DIAGNOSIS — N189 Chronic kidney disease, unspecified: Secondary | ICD-10-CM | POA: Diagnosis not present

## 2017-07-21 LAB — URINALYSIS, COMPLETE (UACMP) WITH MICROSCOPIC
Bacteria, UA: NONE SEEN
Bilirubin Urine: NEGATIVE
GLUCOSE, UA: NEGATIVE mg/dL
Ketones, ur: 5 mg/dL — AB
Nitrite: POSITIVE — AB
PROTEIN: NEGATIVE mg/dL
SPECIFIC GRAVITY, URINE: 1.006 (ref 1.005–1.030)
pH: 7 (ref 5.0–8.0)

## 2017-07-21 LAB — CBC
HCT: 41 % (ref 40.0–52.0)
Hemoglobin: 14 g/dL (ref 13.0–18.0)
MCH: 31.6 pg (ref 26.0–34.0)
MCHC: 34.1 g/dL (ref 32.0–36.0)
MCV: 92.6 fL (ref 80.0–100.0)
PLATELETS: 170 10*3/uL (ref 150–440)
RBC: 4.43 MIL/uL (ref 4.40–5.90)
RDW: 14.8 % — AB (ref 11.5–14.5)
WBC: 9.3 10*3/uL (ref 3.8–10.6)

## 2017-07-21 LAB — BASIC METABOLIC PANEL
Anion gap: 10 (ref 5–15)
BUN: 37 mg/dL — AB (ref 6–20)
CALCIUM: 8.8 mg/dL — AB (ref 8.9–10.3)
CO2: 26 mmol/L (ref 22–32)
CREATININE: 1.48 mg/dL — AB (ref 0.61–1.24)
Chloride: 103 mmol/L (ref 101–111)
GFR calc non Af Amer: 38 mL/min — ABNORMAL LOW (ref >60)
GFR, EST AFRICAN AMERICAN: 44 mL/min — AB (ref >60)
Glucose, Bld: 120 mg/dL — ABNORMAL HIGH (ref 65–99)
Potassium: 4.3 mmol/L (ref 3.5–5.1)
Sodium: 139 mmol/L (ref 135–145)

## 2017-07-21 MED ORDER — CEPHALEXIN 500 MG PO CAPS: 500.0000 mg | ORAL_CAPSULE | Freq: Three times a day (TID) | ORAL | 0 refills | Status: AC

## 2017-07-21 MED ORDER — SODIUM CHLORIDE 0.9 % IV BOLUS: 1000.0000 mL | Freq: Once | INTRAVENOUS | Status: DC

## 2017-07-21 MED ORDER — ACETAMINOPHEN 325 MG PO TABS
650.0000 mg | ORAL_TABLET | Freq: Once | ORAL | Status: AC
  Administered 2017-07-21: 650 mg via ORAL
  Filled 2017-07-21: qty 2

## 2017-07-21 MED ORDER — SODIUM CHLORIDE 0.9 % IV BOLUS
500.0000 mL | Freq: Once | INTRAVENOUS | Status: AC
  Administered 2017-07-21: 500 mL via INTRAVENOUS

## 2017-07-21 MED ORDER — SODIUM CHLORIDE 0.9 % IV SOLN
1.0000 g | Freq: Once | INTRAVENOUS | Status: AC
  Administered 2017-07-21: 1 g via INTRAVENOUS
  Filled 2017-07-21: qty 10

## 2017-07-21 NOTE — ED Triage Notes (Signed)
Pt to ED via [facility] from [facility] for fall and possible syncopal episode. Per EMS Pt was standing at nurses and that pt may have passed out. Pt has hx/o dementia. Pt is in NAD at this time c/o neck and pain between his shoulders.

## 2017-07-21 NOTE — ED Provider Notes (Signed)
[facility] Emergency Department Provider Note    First MD Initiated Contact with Patient 07/21/17 1100     (approximate)  I have reviewed the triage vital signs and the nursing notes.   HISTORY  Chief Complaint Fall    HPI Vincent [name] is a 84 y.o. male [place] of dementia who presents to the ER from [place] after possible near syncopal event and fall.  Patient does have underlying dementia and is not able to provide much additional history.  Per EMS patient was walking and nurses saw the patient nearly passed out but there was no loss of consciousness.  No report of there was any head injury.  Patient denies any pain.  No chest pain or shortness of breath.  No abdominal pain.  No nausea.  Was just recently placed to [facility].  Denies any fevers dysuria or diarrhea.  Past Medical History:  Diagnosis Date  . AICD (automatic cardioverter/defibrillator) present   . Atrial fibrillation (HCC)   . CHF (congestive heart failure) (HCC)   . CKD (chronic kidney disease)   . Coronary artery disease   . GERD (gastroesophageal reflux disease)   . Presence of permanent cardiac pacemaker    Family History  Problem Relation Age of Onset  . Heart attack Mother   . Heart disease Neg Hx   . Diabetes Neg Hx    Past Surgical History:  Procedure Laterality Date  . CATARACT EXTRACTION    . CORONARY ARTERY BYPASS GRAFT    . HERNIA REPAIR     Patient Active Problem List   Diagnosis Date Noted  . UTI (urinary tract infection) 03/16/2016  . Sepsis (HCC) 03/15/2016  . CAP (community acquired pneumonia) 03/15/2016  . Chronic systolic CHF (congestive heart failure) (HCC) 03/15/2016  . Atrial fibrillation (HCC) 03/15/2016  . GERD (gastroesophageal reflux disease) 03/15/2016  . CAD (coronary artery disease) 03/15/2016  . Cellulitis 12/10/2015      Prior to Admission medications   Medication Sig Start Date End Date Taking? Authorizing Provider  ALPRAZolam  Prudy Feeler) 0.25 MG tablet Take 0.25 mg by mouth at bedtime as needed for sleep.   Yes [provider]  bumetanide (BUMEX) 0.5 MG tablet Take 0.5 mg by mouth daily.   Yes [provider]  divalproex (DEPAKOTE) 125 MG DR tablet Take 125 mg by mouth 2 (two) times daily as needed (agitation).   Yes [provider]  furosemide (LASIX) 40 MG tablet Take 1 tablet by mouth 2 (two) times daily. 04/16/15  Yes [provider]  galantamine (RAZADYNE) 4 MG tablet Take 1 tablet (4 mg total) by mouth daily. 03/19/16  Yes Auburn Bilberry, MD  isosorbide dinitrate (ISORDIL) 30 MG tablet Take 30 mg by mouth every morning.   Yes [provider]  potassium chloride (KLOR-CON) 20 MEQ packet Take 20 mEq by mouth daily. 03/19/16  Yes Auburn Bilberry, MD  simvastatin (ZOCOR) 40 MG tablet Take 1 tablet by mouth every evening.    Yes [provider]  warfarin (COUMADIN) 4 MG tablet Take 1 tablet by mouth every evening.    Yes [provider]  benzonatate (TESSALON) 200 MG capsule Take 1 capsule (200 mg total) by mouth 3 (three) times daily as needed for cough. Patient not taking: Reported on 07/21/2017 03/19/16   Auburn Bilberry, MD  cephALEXin (KEFLEX) 500 MG capsule Take 1 capsule (500 mg total) by mouth 3 (three) times daily for 7 days. 07/21/17 07/28/17  Willy Eddy,  MD  docusate sodium (COLACE) 100 MG capsule Take 1 capsule (100 mg total) by mouth 2 (two) times daily. Patient not taking: Reported on 03/15/2016 12/13/15   Hower, Cletis Athens, MD  guaiFENesin-dextromethorphan (ROBITUSSIN DM) 100-10 MG/5ML syrup Take 5 mLs by mouth every 4 (four) hours as needed for cough. Patient not taking: Reported on 07/21/2017 03/19/16   Auburn Bilberry, MD  polyethylene glycol [facility] / Ethelene Hal) packet Take 17 g by mouth daily as needed for mild constipation or moderate constipation. Patient not taking: Reported on 03/15/2016 12/13/15   Hower, Cletis Athens, MD    Allergies Atorvastatin;  Codeine; and Meperidine    Social History Social History   Tobacco Use  . Smoking status: Never Smoker  . Smokeless tobacco: Never Used  Substance Use Topics  . Alcohol use: No    Alcohol/week: 0.0 oz  . Drug use: No    Review of Systems Patient denies headaches, rhinorrhea, blurry vision, numbness, shortness of breath, chest pain, edema, cough, abdominal pain, nausea, vomiting, diarrhea, dysuria, fevers, rashes or hallucinations unless otherwise stated above in HPI. ____________________________________________   PHYSICAL EXAM:  VITAL SIGNS: Vitals:   07/21/17 1130 07/21/17 1358  BP: 136/75 (!) 146/82  Pulse: 74 74  Resp: 16 18  Temp:    SpO2: 99% 98%    Constitutional: Alert and oriented. Well appearing and in no acute distress. Eyes: Conjunctivae are normal.  Head: Atraumatic. Nose: No congestion/rhinnorhea. Mouth/Throat: Mucous membranes are moist.   Neck: No stridor. Painless ROM.  Cardiovascular: Normal rate, regular rhythm. Grossly normal heart sounds.  Good peripheral circulation. Respiratory: Normal respiratory effort.  No retractions. Lungs CTAB. Gastrointestinal: Soft and nontender. No distention. No abdominal bruits. No CVA tenderness. Genitourinary:  Musculoskeletal: No lower extremity tenderness nor edema.  No joint effusions. Neurologic:  Normal speech and language. No gross focal neurologic deficits are appreciated. No facial droop Skin:  Skin is warm, diffuse AKs. No rash noted. Psychiatric: Mood and affect are normal. Speech and behavior are normal.  ____________________________________________   LABS (all labs ordered are listed, but only abnormal results are displayed)  Results for orders placed or performed during the hospital encounter of 07/21/17 (from the past 24 hour(s))  Basic metabolic panel     Status: Abnormal   Collection Time: 07/21/17 10:56 AM  Result Value Ref Range   Sodium 139 135 - 145 mmol/L   Potassium 4.3 3.5 - 5.1 mmol/L    Chloride 103 101 - 111 mmol/L   CO2 26 22 - 32 mmol/L   Glucose, Bld 120 (H) 65 - 99 mg/dL   BUN 37 (H) 6 - 20 mg/dL   Creatinine, Ser 1.61 (H) 0.61 - 1.24 mg/dL   Calcium 8.8 (L) 8.9 - 10.3 mg/dL   GFR calc non Af Amer 38 (L) >60 mL/min   GFR calc Af Amer 44 (L) >60 mL/min   Anion gap 10 5 - 15  CBC     Status: Abnormal   Collection Time: 07/21/17 10:56 AM  Result Value Ref Range   WBC 9.3 3.8 - 10.6 K/uL   RBC 4.43 4.40 - 5.90 MIL/uL   Hemoglobin 14.0 13.0 - 18.0 g/dL   HCT 09.6 04.5 - 40.9 %   MCV 92.6 80.0 - 100.0 fL   MCH 31.6 26.0 - 34.0 pg   MCHC 34.1 32.0 - 36.0 g/dL   RDW 81.1 (H) 91.4 - 78.2 %   Platelets 170 150 - 440 K/uL  Urinalysis, Complete w Microscopic  Status: Abnormal   Collection Time: 07/21/17 10:57 AM  Result Value Ref Range   Color, Urine YELLOW (A) YELLOW   APPearance HAZY (A) CLEAR   Specific Gravity, Urine 1.006 1.005 - 1.030   pH 7.0 5.0 - 8.0   Glucose, UA NEGATIVE NEGATIVE mg/dL   Hgb urine dipstick SMALL (A) NEGATIVE   Bilirubin Urine NEGATIVE NEGATIVE   Ketones, ur 5 (A) NEGATIVE mg/dL   Protein, ur NEGATIVE NEGATIVE mg/dL   Nitrite POSITIVE (A) NEGATIVE   Leukocytes, UA LARGE (A) NEGATIVE   RBC / HPF 0-5 0 - 5 RBC/hpf   WBC, UA 21-50 0 - 5 WBC/hpf   Bacteria, UA NONE SEEN NONE SEEN   Squamous Epithelial / LPF 0-5 0 - 5   WBC Clumps PRESENT    Mucus PRESENT    Amorphous Crystal PRESENT    ____________________________________________  EKG My review and personal interpretation at Time: 10:54   Indication: sob  Rate: 75  Rhythm: paced  Axis: left Other: normal intervals, paced rhythm ____________________________________________  RADIOLOGY  I personally reviewed all radiographic images ordered to evaluate for the above acute complaints and reviewed radiology reports and findings.  These findings were personally discussed with the patient.  Please see medical record for radiology  report.  ____________________________________________   PROCEDURES  Procedure(s) performed:  Procedures    Critical Care performed: no ____________________________________________   INITIAL IMPRESSION / ASSESSMENT AND PLAN / ED COURSE  Pertinent labs & imaging results that were available during my care of the patient were reviewed by me and considered in my medical decision making (see chart for details).  DDX: Dehydration, sepsis, pna, uti, hypoglycemia, cva, drug effect, withdrawal, encephalitis   Vincent [name] is a 84 y.o. who presents to the ED with venous fall and near syncopal episode as described above.  Patient well-appearing and in no acute distress.  Presentation somewhat comp gated by his underlying dementia but patient denies any pain in his nontoxic.  Blood work sent for the above differential.  CT imaging will be ordered to evaluate for traumatic injury as he cannot recall LOC or head injury.  Clinical Course as of Jul 22 1442  Sun Jul 21, 2017  1346 CT imaging shows no evidence of acute abnormality.  Patient does have UTI and nitrate pause Rocephin.   [PR]  1352 RDW(!): 14.8 [PR]    Clinical Course User Index [PR] Willy Eddy, MD   ----------------------------------------- 2:42 PM on 07/21/2017 -----------------------------------------  Patient tolerated Rocephin without any convocation.  Patient will be given prescription for Keflex.  Patient stable and appropriate for discharge back to [facility].  As part of my medical decision making, I reviewed the following data within the electronic MEDICAL RECORD NUMBER Nursing notes reviewed and incorporated, Labs reviewed, notes from prior ED visits.   ____________________________________________   FINAL CLINICAL IMPRESSION(S) / ED DIAGNOSES  Final diagnoses:  Acute cystitis with hematuria  Near syncope      NEW MEDICATIONS STARTED DURING THIS VISIT:  New Prescriptions   CEPHALEXIN (KEFLEX) 500  MG CAPSULE    Take 1 capsule (500 mg total) by mouth 3 (three) times daily for 7 days.     Note:  This document was prepared using Dragon voice recognition software and may include unintentional dictation errors.    Willy Eddy, MD 07/21/17 1444

## 2017-07-21 NOTE — ED Notes (Signed)
Family is no longer at bedside. EMT has been contacted for transport.

## 2017-07-21 NOTE — ED Notes (Signed)
Patient taken to CT scan.

## 2017-07-21 NOTE — ED Notes (Signed)
TB skin test to right FA, negative.

## 2017-07-24 LAB — URINE CULTURE: Culture: >=100000 — AB

## 2017-07-25 NOTE — Progress Notes (Signed)
ED CULTURE REPORT   84 yo male presented with episode of syncope from assisted living at [facility]. Patient has dementia at baseline. A urine culture was obtained and he was discharged on cephalexin  TID x 7days. Urine cultures resulted on 07/25/17 showing Enterococcus faecalis sensitive to ampicillin. I spoke with ED MD Dr. Sharma Covert who agreed that patient should be switched to Amoxicillin. I called his assisted living center which re-directed me to their pharmacy ([facility]). I spoke with Wilkie Aye and called in a prescription for Amoxicillin  PO Q8H x 5 days.   Results for orders placed or performed during the hospital encounter of 07/21/17  Urine culture     Status: Abnormal   Collection Time: 07/21/17 10:57 AM  Result Value Ref Range Status   Specimen Description   Final    URINE, RANDOM Performed at [facility], [address], [place], [place] [address]    Special Requests   Final    NONE Performed at [facility], [address] [place], [place] [address]    Culture >=100,000 COLONIES/mL ENTEROCOCCUS FAECALIS (A)  Final   Report Status 07/24/2017 FINAL  Final   Organism ID, Bacteria ENTEROCOCCUS FAECALIS (A)  Final      Susceptibility   Enterococcus faecalis - MIC*    AMPICILLIN <=2 SENSITIVE Sensitive     LEVOFLOXACIN >=8 RESISTANT Resistant     NITROFURANTOIN <=16 SENSITIVE Sensitive     VANCOMYCIN 1 SENSITIVE Sensitive     * >=100,000 COLONIES/mL ENTEROCOCCUS FAECALIS   Yolanda Bonine, PharmD Pharmacy Resident

## 2018-05-07 ENCOUNTER — Emergency Department: Payer: Medicare Other

## 2018-05-07 ENCOUNTER — Emergency Department
Admission: EM | Admit: 2018-05-07 | Discharge: 2018-05-07 | Disposition: A | Discharge status: Home / Self Care | Payer: Medicare Other | Attending: Emergency Medicine | Admitting: Emergency Medicine

## 2018-05-07 ENCOUNTER — Other Ambulatory Visit: Payer: Self-pay

## 2018-05-07 DIAGNOSIS — I4891 Unspecified atrial fibrillation: Secondary | ICD-10-CM | POA: Insufficient documentation

## 2018-05-07 DIAGNOSIS — I2510 Atherosclerotic heart disease of native coronary artery without angina pectoris: Secondary | ICD-10-CM | POA: Diagnosis not present

## 2018-05-07 DIAGNOSIS — Y92009 Unspecified place in unspecified non-institutional (private) residence as the place of occurrence of the external cause: Secondary | ICD-10-CM | POA: Insufficient documentation

## 2018-05-07 DIAGNOSIS — Y998 Other external cause status: Secondary | ICD-10-CM | POA: Diagnosis not present

## 2018-05-07 DIAGNOSIS — N189 Chronic kidney disease, unspecified: Secondary | ICD-10-CM | POA: Insufficient documentation

## 2018-05-07 DIAGNOSIS — I509 Heart failure, unspecified: Secondary | ICD-10-CM | POA: Diagnosis not present

## 2018-05-07 DIAGNOSIS — S0990XA Unspecified injury of head, initial encounter: Secondary | ICD-10-CM | POA: Diagnosis not present

## 2018-05-07 DIAGNOSIS — W07XXXA Fall from chair, initial encounter: Secondary | ICD-10-CM | POA: Diagnosis not present

## 2018-05-07 DIAGNOSIS — Y9389 Activity, other specified: Secondary | ICD-10-CM | POA: Insufficient documentation

## 2018-05-07 NOTE — ED Notes (Signed)
Family at bedside. Awaits CT scan at this time.

## 2018-05-07 NOTE — ED Provider Notes (Signed)
[facility] Emergency Department Provider Note _______________________   First MD Initiated Contact with Patient 05/07/18 0015     (approximate)  I have reviewed the triage vital signs and the nursing notes.   HISTORY  Chief Complaint Fall   HPI Jeanell Sparrowhomas D Kindel is a 85 y.o. male with below list of chronic medical conditions presents to the emergency department via EMS with history of accidentally falling out of his recliner at the nursing home where he resides.  Patient denies any complaints at present.  Patient does however admit to striking his occiput.  Patient states that he lost balance which caused him to fall.   Past Medical History:  Diagnosis Date  . AICD (automatic cardioverter/defibrillator) present   . Atrial fibrillation (HCC)   . CHF (congestive heart failure) (HCC)   . CKD (chronic kidney disease)   . Coronary artery disease   . GERD (gastroesophageal reflux disease)   . Presence of permanent cardiac pacemaker     Patient Active Problem List   Diagnosis Date Noted  . UTI (urinary tract infection) 03/16/2016  . Sepsis (HCC) 03/15/2016  . CAP (community acquired pneumonia) 03/15/2016  . Chronic systolic CHF (congestive heart failure) (HCC) 03/15/2016  . Atrial fibrillation (HCC) 03/15/2016  . GERD (gastroesophageal reflux disease) 03/15/2016  . CAD (coronary artery disease) 03/15/2016  . Cellulitis 12/10/2015    Past Surgical History:  Procedure Laterality Date  . CATARACT EXTRACTION    . CORONARY ARTERY BYPASS GRAFT    . HERNIA REPAIR      Prior to Admission medications   Medication Sig Start Date End Date Taking? Authorizing Provider  ALPRAZolam Prudy Feeler(XANAX) 0.25 MG tablet Take 0.25 mg by mouth at bedtime as needed for sleep.    [provider]  benzonatate (TESSALON) 200 MG capsule Take 1 capsule (200 mg total) by mouth 3 (three) times daily as needed for cough. Patient not taking: Reported on 07/21/2017 03/19/16   Auburn BilberryPatel,  Shreyang, MD  bumetanide (BUMEX) 0.5 MG tablet Take 0.5 mg by mouth daily.    [provider]  divalproex (DEPAKOTE) 125 MG DR tablet Take 125 mg by mouth 2 (two) times daily as needed (agitation).    [provider]  docusate sodium (COLACE) 100 MG capsule Take 1 capsule (100 mg total) by mouth 2 (two) times daily. Patient not taking: Reported on 03/15/2016 12/13/15   Hower, Cletis Athensavid K, MD  furosemide (LASIX) 40 MG tablet Take 1 tablet by mouth 2 (two) times daily. 04/16/15   [provider]  galantamine (RAZADYNE) 4 MG tablet Take 1 tablet (4 mg total) by mouth daily. 03/19/16   Auburn BilberryPatel, Shreyang, MD  guaiFENesin-dextromethorphan (ROBITUSSIN DM) 100-10 MG/5ML syrup Take 5 mLs by mouth every 4 (four) hours as needed for cough. Patient not taking: Reported on 07/21/2017 03/19/16   Auburn BilberryPatel, Shreyang, MD  isosorbide dinitrate (ISORDIL) 30 MG tablet Take 30 mg by mouth every morning.    [provider]  polyethylene glycol (MIRALAX / GLYCOLAX) packet Take 17 g by mouth daily as needed for mild constipation or moderate constipation. Patient not taking: Reported on 03/15/2016 12/13/15   Hower, Cletis Athensavid K, MD  potassium chloride (KLOR-CON) 20 MEQ packet Take 20 mEq by mouth daily. 03/19/16   Auburn BilberryPatel, Shreyang, MD  simvastatin (ZOCOR) 40 MG tablet Take 1 tablet by mouth every evening.     [provider]  warfarin (COUMADIN) 4 MG tablet Take 1 tablet by mouth every evening.  [provider]    Allergies Atorvastatin; Codeine; and Meperidine  Family History  Problem Relation Age of Onset  . Heart attack Mother   . Heart disease Neg Hx   . Diabetes Neg Hx     Social History Social History   Tobacco Use  . Smoking status: Never Smoker  . Smokeless tobacco: Never Used  Substance Use Topics  . Alcohol use: No    Alcohol/week: 0.0 standard drinks  . Drug use: No    Review of Systems Constitutional: No fever/chills Eyes: No visual changes. ENT: No sore  throat. Cardiovascular: Denies chest pain. Respiratory: Denies shortness of breath. Gastrointestinal: No abdominal pain.  No nausea, no vomiting.  No diarrhea.  No constipation. Genitourinary: Negative for dysuria. Musculoskeletal: Negative for neck pain.  Negative for back pain. Integumentary: Negative for rash. Neurological: Negative for headaches, focal weakness or numbness.  ____________________________________________   PHYSICAL EXAM:  VITAL SIGNS: ED Triage Vitals  Enc Vitals Group     BP 05/07/18 0007 (!) 163/79     Pulse Rate 05/07/18 0007 80     Resp --      Temp 05/07/18 0007 98.6 F (37 C)     Temp Source 05/07/18 0007 Oral     SpO2 05/07/18 0005 (!) 79 %     Weight 05/07/18 0009 94.5 kg (208 lb 5.4 oz)     Height 05/07/18 0009 1.727 m (5\' 8" )     Head Circumference --      Peak Flow --      Pain Score 05/07/18 0009 0     Pain Loc --      Pain Edu? --      Excl. in GC? --     Constitutional: Alert and oriented. Well appearing and in no acute distress. Eyes: Conjunctivae are normal. {**PERRL. EOMI. Head: Atraumatic. Mouth/Throat: Mucous membranes are moist. Oropharynx non-erythematous. Neck: No stridor.   Cardiovascular: Normal rate, regular rhythm. Good peripheral circulation. Grossly normal heart sounds. Respiratory: Normal respiratory effort.  No retractions. Lungs CTAB. Gastrointestinal: Soft and nontender. No distention.  Musculoskeletal: No lower extremity tenderness nor edema. No gross deformities of extremities. Neurologic:  Normal speech and language. No gross focal neurologic deficits are appreciated.  Skin:  Skin is warm, dry and intact.  _______________  RADIOLOGY I, [place] Geraldene Eisel, personally viewed and evaluated these images (plain radiographs) as part of my medical decision making, as well as reviewing the written report by the radiologist.  ED MD interpretation: No acute intracranial abnormality noted on CT head per  radiologist  Official radiology report(s): Ct Head Wo Contrast  Result Date: 05/07/2018 CLINICAL DATA:  Fall. On aspirin. EXAM: CT HEAD WITHOUT CONTRAST TECHNIQUE: Contiguous axial images were obtained from the base of the skull through the vertex without intravenous contrast. COMPARISON:  07/21/2017 head CT FINDINGS: Brain: There is no mass, hemorrhage or extra-axial collection. The size and configuration of the ventricles and extra-axial CSF spaces are normal. There is hypoattenuation of the white matter, most commonly indicating chronic small vessel disease. Vascular: Atherosclerotic calcification of the vertebral and internal carotid arteries at the skull base. No abnormal hyperdensity of the major intracranial arteries or dural venous sinuses. Skull: No skull fracture. Multiple scalp soft tissues lesions. Sinuses/Orbits: Postsurgical changes of the nasal cavity and right maxillary sinus. No mastoid or middle ear effusion. The orbits are normal. IMPRESSION: 1. No acute intracranial hemorrhage. 2. Findings of chronic small vessel disease. 3. Multiple scalp soft tissue lesions. Electronically Signed  By: Deatra Robinson M.D.   On: 05/07/2018 01:05    _____________________ Procedures   ____________________________________________   INITIAL IMPRESSION / ASSESSMENT AND PLAN / ED COURSE  As part of my medical decision making, I reviewed the following data within the electronic MEDICAL RECORD NUMBER   85 year old male presenting to the emergency department following reported accidental fall.  Given reported head injury CT head performed which revealed no acute intracranial abnormality.  __________________________  FINAL CLINICAL IMPRESSION(S) / ED DIAGNOSES  Final diagnoses:  Injury of head, initial encounter     MEDICATIONS GIVEN DURING THIS VISIT:  Medications - No data to display   ED Discharge Orders    None       Note:  This document was prepared using Dragon voice recognition  software and may include unintentional dictation errors.   Darci Current, MD 05/07/18 [phone]

## 2018-05-07 NOTE — ED Notes (Signed)
PT and family verbalized understanding of d/c instructions, and f/u care. No further questions. Pt awaits transport via EMS at this time.

## 2018-05-07 NOTE — ED Notes (Signed)
Patient transported to CT 

## 2018-05-07 NOTE — ED Notes (Signed)
Pt off the floor with EMS back to [facility].

## 2018-05-07 NOTE — ED Triage Notes (Signed)
Pt BIB EMS from [facility] for a mechanical fall, pt reports hitting his head, is on ASA, denies any pain at this time. Is at his baseline mental status per EMS with a hx of Dementia. VSS.

## 2018-08-29 ENCOUNTER — Emergency Department: Payer: Medicare Other

## 2018-08-29 ENCOUNTER — Other Ambulatory Visit: Payer: Self-pay

## 2018-08-29 ENCOUNTER — Inpatient Hospital Stay
Admission: EM | Admit: 2018-08-29 | Discharge: 2018-09-17 | DRG: 480 | Disposition: E | Payer: Medicare Other | Source: Skilled Nursing Facility | Attending: Internal Medicine | Admitting: Internal Medicine

## 2018-08-29 DIAGNOSIS — S72001A Fracture of unspecified part of neck of right femur, initial encounter for closed fracture: Secondary | ICD-10-CM | POA: Diagnosis present

## 2018-08-29 DIAGNOSIS — Z7901 Long term (current) use of anticoagulants: Secondary | ICD-10-CM

## 2018-08-29 DIAGNOSIS — Z888 Allergy status to other drugs, medicaments and biological substances status: Secondary | ICD-10-CM

## 2018-08-29 DIAGNOSIS — N183 Chronic kidney disease, stage 3 unspecified: Secondary | ICD-10-CM | POA: Diagnosis present

## 2018-08-29 DIAGNOSIS — E785 Hyperlipidemia, unspecified: Secondary | ICD-10-CM | POA: Diagnosis present

## 2018-08-29 DIAGNOSIS — I2510 Atherosclerotic heart disease of native coronary artery without angina pectoris: Secondary | ICD-10-CM | POA: Diagnosis present

## 2018-08-29 DIAGNOSIS — Z950 Presence of cardiac pacemaker: Secondary | ICD-10-CM | POA: Diagnosis not present

## 2018-08-29 DIAGNOSIS — S72141A Displaced intertrochanteric fracture of right femur, initial encounter for closed fracture: Principal | ICD-10-CM | POA: Diagnosis present

## 2018-08-29 DIAGNOSIS — Z79899 Other long term (current) drug therapy: Secondary | ICD-10-CM | POA: Diagnosis not present

## 2018-08-29 DIAGNOSIS — I4891 Unspecified atrial fibrillation: Secondary | ICD-10-CM | POA: Diagnosis present

## 2018-08-29 DIAGNOSIS — F0390 Unspecified dementia without behavioral disturbance: Secondary | ICD-10-CM | POA: Diagnosis present

## 2018-08-29 DIAGNOSIS — W1830XA Fall on same level, unspecified, initial encounter: Secondary | ICD-10-CM | POA: Diagnosis present

## 2018-08-29 DIAGNOSIS — Z8781 Personal history of (healed) traumatic fracture: Secondary | ICD-10-CM

## 2018-08-29 DIAGNOSIS — I130 Hypertensive heart and chronic kidney disease with heart failure and stage 1 through stage 4 chronic kidney disease, or unspecified chronic kidney disease: Secondary | ICD-10-CM | POA: Diagnosis present

## 2018-08-29 DIAGNOSIS — J9601 Acute respiratory failure with hypoxia: Secondary | ICD-10-CM | POA: Diagnosis not present

## 2018-08-29 DIAGNOSIS — Z8249 Family history of ischemic heart disease and other diseases of the circulatory system: Secondary | ICD-10-CM

## 2018-08-29 DIAGNOSIS — G9340 Encephalopathy, unspecified: Secondary | ICD-10-CM | POA: Diagnosis not present

## 2018-08-29 DIAGNOSIS — I252 Old myocardial infarction: Secondary | ICD-10-CM

## 2018-08-29 DIAGNOSIS — S72143A Displaced intertrochanteric fracture of unspecified femur, initial encounter for closed fracture: Secondary | ICD-10-CM | POA: Diagnosis present

## 2018-08-29 DIAGNOSIS — Z885 Allergy status to narcotic agent status: Secondary | ICD-10-CM | POA: Diagnosis not present

## 2018-08-29 DIAGNOSIS — Z951 Presence of aortocoronary bypass graft: Secondary | ICD-10-CM

## 2018-08-29 DIAGNOSIS — Z9181 History of falling: Secondary | ICD-10-CM

## 2018-08-29 DIAGNOSIS — M25551 Pain in right hip: Secondary | ICD-10-CM | POA: Diagnosis present

## 2018-08-29 DIAGNOSIS — Y92129 Unspecified place in nursing home as the place of occurrence of the external cause: Secondary | ICD-10-CM

## 2018-08-29 DIAGNOSIS — K219 Gastro-esophageal reflux disease without esophagitis: Secondary | ICD-10-CM | POA: Diagnosis present

## 2018-08-29 DIAGNOSIS — H9190 Unspecified hearing loss, unspecified ear: Secondary | ICD-10-CM | POA: Diagnosis present

## 2018-08-29 DIAGNOSIS — Z66 Do not resuscitate: Secondary | ICD-10-CM | POA: Diagnosis present

## 2018-08-29 DIAGNOSIS — I5022 Chronic systolic (congestive) heart failure: Secondary | ICD-10-CM | POA: Diagnosis present

## 2018-08-29 DIAGNOSIS — Z515 Encounter for palliative care: Secondary | ICD-10-CM | POA: Diagnosis not present

## 2018-08-29 DIAGNOSIS — J9602 Acute respiratory failure with hypercapnia: Secondary | ICD-10-CM | POA: Diagnosis not present

## 2018-08-29 DIAGNOSIS — Z1159 Encounter for screening for other viral diseases: Secondary | ICD-10-CM | POA: Diagnosis not present

## 2018-08-29 LAB — CBC WITH DIFFERENTIAL/PLATELET
Abs Immature Granulocytes: 0.05 10*3/uL (ref 0.00–0.07)
Basophils Absolute: 0 10*3/uL (ref 0.0–0.1)
Basophils Relative: 0 %
Eosinophils Absolute: 0.1 10*3/uL (ref 0.0–0.5)
Eosinophils Relative: 1 %
HCT: 40.5 % (ref 39.0–52.0)
Hemoglobin: 13.3 g/dL (ref 13.0–17.0)
Immature Granulocytes: 1 %
Lymphocytes Relative: 19 %
Lymphs Abs: 1.9 10*3/uL (ref 0.7–4.0)
MCH: 31.4 pg (ref 26.0–34.0)
MCHC: 32.8 g/dL (ref 30.0–36.0)
MCV: 95.7 fL (ref 80.0–100.0)
Monocytes Absolute: 0.8 10*3/uL (ref 0.1–1.0)
Monocytes Relative: 8 %
Neutro Abs: 7.2 10*3/uL (ref 1.7–7.7)
Neutrophils Relative %: 71 %
Platelets: 114 10*3/uL — ABNORMAL LOW (ref 150–400)
RBC: 4.23 MIL/uL (ref 4.22–5.81)
RDW: 12.4 % (ref 11.5–15.5)
WBC: 10 10*3/uL (ref 4.0–10.5)
nRBC: 0 % (ref 0.0–0.2)

## 2018-08-29 LAB — TYPE AND SCREEN
ABO/RH(D): O POS
Antibody Screen: NEGATIVE

## 2018-08-29 LAB — URINALYSIS, COMPLETE (UACMP) WITH MICROSCOPIC
Bacteria, UA: NONE SEEN
Bilirubin Urine: NEGATIVE
Glucose, UA: NEGATIVE mg/dL
Ketones, ur: 5 mg/dL — AB
Leukocytes,Ua: NEGATIVE
Nitrite: NEGATIVE
Protein, ur: NEGATIVE mg/dL
Specific Gravity, Urine: 1.012 (ref 1.005–1.030)
pH: 6 (ref 5.0–8.0)

## 2018-08-29 LAB — COMPREHENSIVE METABOLIC PANEL
ALT: 13 U/L (ref 0–44)
AST: 18 U/L (ref 15–41)
Albumin: 3.8 g/dL (ref 3.5–5.0)
Alkaline Phosphatase: 133 U/L — ABNORMAL HIGH (ref 38–126)
Anion gap: 11 (ref 5–15)
BUN: 27 mg/dL — ABNORMAL HIGH (ref 8–23)
CO2: 24 mmol/L (ref 22–32)
Calcium: 8.6 mg/dL — ABNORMAL LOW (ref 8.9–10.3)
Chloride: 105 mmol/L (ref 98–111)
Creatinine, Ser: 1.33 mg/dL — ABNORMAL HIGH (ref 0.61–1.24)
GFR calc Af Amer: 52 mL/min — ABNORMAL LOW (ref >60)
GFR calc non Af Amer: 44 mL/min — ABNORMAL LOW (ref >60)
Glucose, Bld: 160 mg/dL — ABNORMAL HIGH (ref 70–99)
Potassium: 3.8 mmol/L (ref 3.5–5.1)
Sodium: 140 mmol/L (ref 135–145)
Total Bilirubin: 0.7 mg/dL (ref 0.3–1.2)
Total Protein: 6.7 g/dL (ref 6.5–8.1)

## 2018-08-29 LAB — PROTIME-INR
INR: 1.2 (ref 0.8–1.2)
Prothrombin Time: 14.9 seconds (ref 11.4–15.2)

## 2018-08-29 LAB — APTT: aPTT: 31 seconds (ref 24–36)

## 2018-08-29 LAB — SARS CORONAVIRUS 2 BY RT PCR (HOSPITAL ORDER, PERFORMED IN ~~LOC~~ HOSPITAL LAB): SARS Coronavirus 2: NEGATIVE

## 2018-08-29 MED ORDER — ACETAMINOPHEN 650 MG RE SUPP: 650.0000 mg | Freq: Four times a day (QID) | RECTAL | Status: DC | PRN

## 2018-08-29 MED ORDER — ONDANSETRON HCL 4 MG PO TABS: 4.0000 mg | ORAL_TABLET | Freq: Four times a day (QID) | ORAL | Status: DC | PRN

## 2018-08-29 MED ORDER — MORPHINE SULFATE (PF) 4 MG/ML IV SOLN
4.0000 mg | INTRAVENOUS | Status: DC | PRN
  Administered 2018-08-30 – 2018-08-31 (×5): 4 mg via INTRAVENOUS
  Filled 2018-08-29 (×5): qty 1

## 2018-08-29 MED ORDER — FENTANYL CITRATE (PF) 100 MCG/2ML IJ SOLN
50.0000 ug | Freq: Once | INTRAMUSCULAR | Status: AC
  Administered 2018-08-29: 50 ug via INTRAVENOUS
  Filled 2018-08-29: qty 2

## 2018-08-29 MED ORDER — ONDANSETRON HCL 4 MG/2ML IJ SOLN
4.0000 mg | Freq: Four times a day (QID) | INTRAMUSCULAR | Status: DC | PRN
  Administered 2018-08-29: 4 mg via INTRAVENOUS

## 2018-08-29 MED ORDER — BUMETANIDE 0.5 MG PO TABS
0.5000 mg | ORAL_TABLET | Freq: Every day | ORAL | Status: DC
  Filled 2018-08-29 (×3): qty 1

## 2018-08-29 MED ORDER — ONDANSETRON HCL 4 MG/2ML IJ SOLN
INTRAMUSCULAR | Status: AC
  Administered 2018-08-29: 4 mg via INTRAVENOUS
  Filled 2018-08-29: qty 2

## 2018-08-29 MED ORDER — MORPHINE SULFATE (PF) 4 MG/ML IV SOLN
4.0000 mg | Freq: Once | INTRAVENOUS | Status: AC
  Administered 2018-08-29: 4 mg via INTRAVENOUS
  Filled 2018-08-29: qty 1

## 2018-08-29 MED ORDER — OXYCODONE HCL 5 MG PO TABS
5.0000 mg | ORAL_TABLET | ORAL | Status: DC | PRN
  Administered 2018-08-31: 5 mg via ORAL
  Filled 2018-08-29: qty 1

## 2018-08-29 MED ORDER — HEPARIN BOLUS VIA INFUSION
4000.0000 [IU] | Freq: Once | INTRAVENOUS | Status: DC
  Filled 2018-08-29: qty 4000

## 2018-08-29 MED ORDER — DIVALPROEX SODIUM 125 MG PO DR TAB
125.0000 mg | DELAYED_RELEASE_TABLET | Freq: Two times a day (BID) | ORAL | Status: DC | PRN
  Filled 2018-08-29: qty 1

## 2018-08-29 MED ORDER — SIMVASTATIN 20 MG PO TABS: 40.0000 mg | ORAL_TABLET | Freq: Every evening | ORAL | Status: DC

## 2018-08-29 MED ORDER — ACETAMINOPHEN 325 MG PO TABS: 650.0000 mg | ORAL_TABLET | Freq: Four times a day (QID) | ORAL | Status: DC | PRN

## 2018-08-29 MED ORDER — ALPRAZOLAM 0.25 MG PO TABS
0.2500 mg | ORAL_TABLET | Freq: Every evening | ORAL | Status: DC | PRN
Start: 2018-08-29 — End: 2018-09-01
  Administered 2018-08-30: 0.25 mg via ORAL
  Filled 2018-08-29: qty 1

## 2018-08-29 MED ORDER — HEPARIN (PORCINE) 25000 UT/250ML-% IV SOLN: 1200.0000 [IU]/h | INTRAVENOUS | Status: DC

## 2018-08-29 NOTE — H&P (Signed)
[facility] [place] [facility]   PATIENT NAME: Vincent [name] Roads    MR#:  [id number]  DATE OF BIRTH:  [birth year]  DATE OF ADMISSION:  09/10/2018  PRIMARY CARE PHYSICIAN: System, Pcp Not In   REQUESTING/REFERRING PHYSICIAN: Alphonzo LemmingsMcShane, MD  CHIEF COMPLAINT:   Chief Complaint  Patient presents with  . Fall    HISTORY OF PRESENT ILLNESS:  Vincent [name] Veloso  is a 85 y.o. male who presents with chief complaint as above.  Patient presents the ED after mechanical fall with right hip pain.  Imaging confirms right hip fracture.  Patient has significant comorbid conditions, including significant heart disease.  He apparently is able to walk with a cane prior to this fall.  Orthopedic surgery contacted by ED physician, and hospitalist called for admission.  PAST MEDICAL HISTORY:   Past Medical History:  Diagnosis Date  . AICD (automatic cardioverter/defibrillator) present   . Atrial fibrillation (HCC)   . CHF (congestive heart failure) (HCC)   . CKD (chronic kidney disease)   . Coronary artery disease   . GERD (gastroesophageal reflux disease)   . Presence of permanent cardiac pacemaker      PAST SURGICAL HISTORY:   Past Surgical History:  Procedure Laterality Date  . CATARACT EXTRACTION    . CORONARY ARTERY BYPASS GRAFT    . HERNIA REPAIR       SOCIAL HISTORY:   Social History   Tobacco Use  . Smoking status: Never Smoker  . Smokeless tobacco: Never Used  Substance Use Topics  . Alcohol use: No    Alcohol/week: 0.0 standard drinks     FAMILY HISTORY:   Family History  Problem Relation Age of Onset  . Heart attack Mother   . Heart disease Neg Hx   . Diabetes Neg Hx      DRUG ALLERGIES:   Allergies  Allergen Reactions  . Atorvastatin     Other reaction(s): Unknown  . Codeine Nausea Only  . Meperidine Nausea Only    MEDICATIONS AT HOME:   Prior to Admission medications   Medication Sig Start Date End Date Taking? Authorizing  Provider  ALPRAZolam Prudy Feeler(XANAX) 0.25 MG tablet Take 0.25 mg by mouth at bedtime as needed for sleep.   Yes [provider]  bumetanide (BUMEX) 0.5 MG tablet Take 0.5 mg by mouth daily.   Yes [provider]  divalproex (DEPAKOTE) 125 MG DR tablet Take 125 mg by mouth 2 (two) times daily as needed (agitation).   Yes [provider]  furosemide (LASIX) 40 MG tablet Take 1 tablet by mouth 2 (two) times daily. 04/16/15  Yes [provider]  galantamine (RAZADYNE) 4 MG tablet Take 1 tablet (4 mg total) by mouth daily. 03/19/16  Yes Auburn BilberryPatel, Shreyang, MD  isosorbide dinitrate (ISORDIL) 30 MG tablet Take 30 mg by mouth every morning.   Yes [provider]  potassium chloride (KLOR-CON) 20 MEQ packet Take 20 mEq by mouth daily. 03/19/16  Yes Auburn BilberryPatel, Shreyang, MD  simvastatin (ZOCOR) 40 MG tablet Take 1 tablet by mouth every evening.    Yes [provider]  warfarin (COUMADIN) 4 MG tablet Take 1 tablet by mouth every evening.    Yes [provider]  benzonatate (TESSALON) 200 MG capsule Take 1 capsule (200 mg total) by mouth 3 (three) times daily as needed for cough. Patient not taking: Reported on 07/21/2017 03/19/16   Auburn BilberryPatel, Shreyang, MD  docusate sodium (COLACE) 100 MG capsule Take 1 capsule (100  mg total) by mouth 2 (two) times daily. Patient not taking: Reported on 03/15/2016 12/13/15   Hower, Cletis Athensavid K, MD  guaiFENesin-dextromethorphan (ROBITUSSIN DM) 100-10 MG/5ML syrup Take 5 mLs by mouth every 4 (four) hours as needed for cough. Patient not taking: Reported on 07/21/2017 03/19/16   Auburn BilberryPatel, Shreyang, MD  polyethylene glycol [facility](MIRALAX / Ethelene HalGLYCOLAX) packet Take 17 g by mouth daily as needed for mild constipation or moderate constipation. Patient not taking: Reported on 03/15/2016 12/13/15   Hower, Cletis Athensavid K, MD    REVIEW OF SYSTEMS:  Review of Systems  Constitutional: Negative for chills, fever, malaise/fatigue and weight loss.  HENT: Negative for ear pain,  hearing loss and tinnitus.   Eyes: Negative for blurred vision, double vision, pain and redness.  Respiratory: Negative for cough, hemoptysis and shortness of breath.   Cardiovascular: Negative for chest pain, palpitations, orthopnea and leg swelling.  Gastrointestinal: Negative for abdominal pain, constipation, diarrhea, nausea and vomiting.  Genitourinary: Negative for dysuria, frequency and hematuria.  Musculoskeletal: Negative for back pain, joint pain (right hip) and neck pain.  Skin:       No acne, rash, or lesions  Neurological: Negative for dizziness, tremors, focal weakness and weakness.  Endo/Heme/Allergies: Negative for polydipsia. Does not bruise/bleed easily.  Psychiatric/Behavioral: Negative for depression. The patient is not nervous/anxious and does not have insomnia.      VITAL SIGNS:   Vitals:   Sep 18, 2018 2006 Sep 18, 2018 2007 Sep 18, 2018 2030 Sep 18, 2018 2100  BP: (!) 174/92  (!) 179/93 (!) 183/84  Pulse:   77 74  Resp: 20 17 18  (!) 21  Temp:      TempSrc:      SpO2: 97%  98% 99%  Weight:      Height:       Wt Readings from Last 3 Encounters:  Sep 18, 2018 88.5 kg  05/07/18 94.5 kg  03/20/16 94.5 kg    PHYSICAL EXAMINATION:  Physical Exam  Vitals reviewed. Constitutional: He is oriented to person, place, and time. He appears well-developed and well-nourished. No distress.  HENT:  Head: Normocephalic and atraumatic.  Mouth/Throat: Oropharynx is clear and moist.  Eyes: Pupils are equal, round, and reactive to light. Conjunctivae and EOM are normal. No scleral icterus.  Neck: Normal range of motion. Neck supple. No JVD present. No thyromegaly present.  Cardiovascular: Normal rate, regular rhythm and intact distal pulses. Exam reveals no gallop and no friction rub.  No murmur heard. Respiratory: Effort normal and breath sounds normal. No respiratory distress. He has no wheezes. He has no rales.  GI: Soft. Bowel sounds are normal. He exhibits no distension. There is no  abdominal tenderness.  Musculoskeletal: Normal range of motion.        General: Tenderness (right hip) present. No edema.     Comments: No arthritis, no gout  Lymphadenopathy:    He has no cervical adenopathy.  Neurological: He is alert and oriented to person, place, and time. No cranial nerve deficit.  No dysarthria, no aphasia  Skin: Skin is warm and dry. No rash noted. No erythema.  Psychiatric: He has a normal mood and affect. His behavior is normal. Judgment and thought content normal.    LABORATORY PANEL:   CBC Recent Labs  Lab Sep 18, 2018 1959  WBC 10.0  HGB 13.3  HCT 40.5  PLT 114*   ------------------------------------------------------------------------------------------------------------------  Chemistries  Recent Labs  Lab Sep 18, 2018 1959  NA 140  K 3.8  CL 105  CO2 24  GLUCOSE 160*  BUN 27*  CREATININE 1.33*  CALCIUM 8.6*  AST 18  ALT 13  ALKPHOS 133*  BILITOT 0.7   ------------------------------------------------------------------------------------------------------------------  Cardiac Enzymes No results for input(s): TROPONINI in the last 168 hours. ------------------------------------------------------------------------------------------------------------------  RADIOLOGY:  Dg Chest 1 View  Result Date: 08/21/2018 CLINICAL DATA:  Pt arrives via ems from [facility]. Ems reports pt fell From unknown cause without hitting head or any LOC. Hx of dementia at baseline. Outward rotation of the right hip with shortening noted on arrival. EXAM: CHEST  1 VIEW COMPARISON:  07/21/2017 FINDINGS: Stable changes from prior CABG surgery. Cardiac silhouette is mildly enlarged. Right anterior chest wall biventricular cardioverter-defibrillator is stable. No mediastinal or hilar masses. Bilateral apically pleural thickening with associated a pickle parenchymal scarring, unchanged. Lungs otherwise clear. No pleural effusion and no pneumothorax. Skeletal  structures are demineralized. No acute skeletal abnormality. IMPRESSION: No acute cardiopulmonary disease. Electronically Signed   By: Lajean Manes M.D.   On: 09/09/2018 19:52   Dg Hip Unilat W Or Wo Pelvis 2-3 Views Right  Result Date: 09/14/2018 CLINICAL DATA:  Fall with right hip pain and external rotation of the right leg. EXAM: DG HIP (WITH OR WITHOUT PELVIS) 2-3V RIGHT COMPARISON:  None. FINDINGS: There is a comminuted, displaced intertrochanteric fracture of the proximal right femur. The primary proximal fracture component is displaced from the distal fracture component by 1.2 cm superiorly and laterally. There is also mild varus angulation. There separate fractures of the greater and lesser trochanters. There are no other fractures.  No bone lesions. Hip joints, SI joints and symphysis pubis are normally aligned. Skeletal structures are diffusely demineralized. IMPRESSION: Comminuted, mildly displaced intertrochanteric fracture of the proximal right femur. No dislocation. Electronically Signed   By: Lajean Manes M.D.   On: 09/13/2018 19:51    EKG:   Orders placed or performed during the hospital encounter of 08/21/2018  . ED EKG  . ED EKG  . EKG 12-Lead  . EKG 12-Lead    IMPRESSION AND PLAN:  Principal Problem:   Closed right hip fracture [facility]) -orthopedic surgery evaluated the patient in the ED.  They prefer to contact family tomorrow for more in-depth conversation about the possibility of IM nailing surgical repair of the fracture.  They also request cardiac clearance evaluation.  Given the patient's significant cardiac problems, will get cardiology consult for this Active Problems:   Chronic systolic CHF (congestive heart failure) ([place]) -continue home meds   Atrial fibrillation ([place]) -continue home medications.  Patient reportedly stopped taking his Coumadin sometime ago due to fall risk.  His INR is normal tonight.  Will not order anticoagulation at this time   CAD (coronary artery  disease) -continue home meds   CKD (chronic kidney disease), stage III ([place]) -at baseline, avoid nephrotoxins and monitor  Chart review performed and case discussed with ED provider. Labs, imaging and/or ECG reviewed by provider and discussed with patient/family. Management plans discussed with the patient and/or family.  COVID-19 status: Pending  DVT PROPHYLAXIS: Subq heparin  GI PROPHYLAXIS:  None  ADMISSION STATUS: Inpatient     CODE STATUS: DNR Code Status History    Date Active Date Inactive Code Status Order ID Comments User Context   03/15/2016 2340 03/20/2016 2027 Full Code [id number]  Lance Coon, MD Inpatient   12/10/2015 2005 12/13/2015 1104 Full Code [id number]  Henreitta Leber, MD Inpatient   06/03/2015 0438 06/03/2015 1503 Full Code [id number]  Fritzi Mandes, MD Inpatient   Advance Care Planning Activity  Advance Directive Documentation     Most Recent Value  Type of Advance Directive  Out of facility DNR (pink MOST or yellow form)  Pre-existing out of facility DNR order (yellow form or pink MOST form)  Yellow form placed in chart (order not valid for inpatient use), Physician notified to receive inpatient order  "MOST" Form in Place?  -      TOTAL TIME TAKING CARE OF THIS PATIENT: 45 minutes.   This patient was evaluated in the context of the global COVID-19 pandemic, which necessitated consideration that the patient might be at risk for infection with the SARS-CoV-2 virus that causes COVID-19. Institutional protocols and algorithms that pertain to the evaluation of patients at risk for COVID-19 are in a state of rapid change based on information released by regulatory bodies including the CDC and federal and state organizations. These policies and algorithms were followed to the best of this provider's knowledge to date during the patient's care at this facility.  Barney DrainDavid F Oneda Duffett 2018/07/19, 10:11 PM  [facility]  Office  [phone]  CC: Primary  care physician; System, Pcp Not In  Note:  This document was prepared using Dragon voice recognition software and may include unintentional dictation errors.

## 2018-08-29 NOTE — ED Triage Notes (Signed)
Pt arrives via ems from [facility]. Ems reports pt fell  From unknown cause without hitting head or any LOC. Hx of dementia at baseline. Outward rotation of the right hip with shortening noted on arrival. Ems reports giving 75 mcg fentanyl around 1840. Alert on arrival, pt hard of hearing, able to state name and year born, unable to under other questions due to hearing loss and dementia. Pt POA sarah sue brown [phone]. EMS states pt is currently on hospice care, pace maker not working correctly, and states pt unable to have surgery to correct pacemake. NAD noted at this time

## 2018-08-29 NOTE — ED Notes (Signed)
ED TO INPATIENT HANDOFF REPORT  ED Nurse Name and Phone #:  Helmut Musterlicia [phone]  S Name/Age/Gender Vincent [name] 85 y.o. male Room/Bed: ED05A/ED05A  Code Status   Code Status: Prior  Home/SNF/Other Skilled nursing facility Patient oriented to: self Is this baseline? Yes   Triage Complete: Triage complete  Chief Complaint Fall  Triage Note Pt arrives via ems from [facility]. Ems reports pt fell  From unknown cause without hitting head or any LOC. Hx of dementia at baseline. Outward rotation of the right hip with shortening noted on arrival. Ems reports giving 75 mcg fentanyl around 1840. Alert on arrival, pt hard of hearing, able to state name and year born, unable to under other questions due to hearing loss and dementia. Pt POA sarah sue brown [phone]. EMS states pt is currently on hospice care, pace maker not working correctly, and states pt unable to have surgery to correct pacemake. NAD noted at this time    Allergies Allergies  Allergen Reactions  . Atorvastatin     Other reaction(s): Unknown  . Codeine Nausea Only  . Meperidine Nausea Only    Level of Care/Admitting Diagnosis ED Disposition    ED Disposition Condition Comment   Admit  [facility] [[id number]]  Level of Care: Med-Surg [16]  Covid Evaluation: Screening Protocol (No Symptoms)  Diagnosis: Closed right hip fracture [facility](HCC) [[id number]]) [[id number]]  Admitting Physician: Oralia ManisWILLIS, DAVID [[id number]]  Attending Physician: Oralia ManisWILLIS, DAVID [phone][[id number]]  Estimated length of stay: past midnight tomorrow  Certification:: I certify this patient will need inpatient services for at least 2 midnights  PT Class (Do Not Modify): Inpatient [101]  PT Acc Code (Do Not Modify): Private [1]       B Medical/Surgery History Past Medical History:  Diagnosis Date  . AICD (automatic cardioverter/defibrillator) present   . Atrial fibrillation (HCC)   . CHF (congestive heart failure) (HCC)   .  CKD (chronic kidney disease)   . Coronary artery disease   . GERD (gastroesophageal reflux disease)   . Presence of permanent cardiac pacemaker    Past Surgical History:  Procedure Laterality Date  . CATARACT EXTRACTION    . CORONARY ARTERY BYPASS GRAFT    . HERNIA REPAIR       A IV Location/Drains/Wounds Patient Lines/Drains/Airways Status   Active Line/Drains/Airways    Name:   Placement date:   Placement time:   Site:   Days:   Peripheral IV 09/03/2018 Left;Posterior Wrist   09/04/2018    1853    Wrist   less than 1          Intake/Output Last 24 hours No intake or output data in the 24 hours ending 08/24/2018 2220  Labs/Imaging Results for orders placed or performed during the hospital encounter of 09/16/2018 (from the past 48 hour(s))  CBC with Differential     Status: Abnormal   Collection Time: 09/07/2018  7:59 PM  Result Value Ref Range   WBC 10.0 4.0 - 10.5 K/uL   RBC 4.23 4.22 - 5.81 MIL/uL   Hemoglobin 13.3 13.0 - 17.0 g/dL   HCT 41.340.5 24.439.0 - 01.052.0 %   MCV 95.7 80.0 - 100.0 fL   MCH 31.4 26.0 - 34.0 pg   MCHC 32.8 30.0 - 36.0 g/dL   RDW 27.212.4 53.611.5 - 64.415.5 %   Platelets 114 (L) 150 - 400 K/uL    Comment: Immature Platelet Fraction may be clinically indicated, consider ordering this additional test [id number]  nRBC 0.0 0.0 - 0.2 %   Neutrophils Relative % 71 %   Neutro Abs 7.2 1.7 - 7.7 K/uL   Lymphocytes Relative 19 %   Lymphs Abs 1.9 0.7 - 4.0 K/uL   Monocytes Relative 8 %   Monocytes Absolute 0.8 0.1 - 1.0 K/uL   Eosinophils Relative 1 %   Eosinophils Absolute 0.1 0.0 - 0.5 K/uL   Basophils Relative 0 %   Basophils Absolute 0.0 0.0 - 0.1 K/uL   Immature Granulocytes 1 %   Abs Immature Granulocytes 0.05 0.00 - 0.07 K/uL    Comment: Performed at [facility], [address] [place], [place] [address]  Protime-INR     Status: None   Collection Time: 2018/09/05  7:59 PM  Result Value Ref Range   Prothrombin Time 14.9 11.4 - 15.2 seconds   INR 1.2 0.8  - 1.2    Comment: (NOTE) INR goal varies based on device and disease states. Performed at [facility], [address] [place], [place] [address]   Type and screen [facility]     Status: None   Collection Time: 2018/09/05  7:59 PM  Result Value Ref Range   ABO/RH(D) O POS    Antibody Screen NEG    Sample Expiration      09/12/2018,2359 Performed at [facility], [address] [place], [place] [address]   Comprehensive metabolic panel     Status: Abnormal   Collection Time: 2018/09/05  7:59 PM  Result Value Ref Range   Sodium 140 135 - 145 mmol/L   Potassium 3.8 3.5 - 5.1 mmol/L   Chloride 105 98 - 111 mmol/L   CO2 24 22 - 32 mmol/L   Glucose, Bld 160 (H) 70 - 99 mg/dL   BUN 27 (H) 8 - 23 mg/dL   Creatinine, Ser 9.141.33 (H) 0.61 - 1.24 mg/dL   Calcium 8.6 (L) 8.9 - 10.3 mg/dL   Total Protein 6.7 6.5 - 8.1 g/dL   Albumin 3.8 3.5 - 5.0 g/dL   AST 18 15 - 41 U/L   ALT 13 0 - 44 U/L   Alkaline Phosphatase 133 (H) 38 - 126 U/L   Total Bilirubin 0.7 0.3 - 1.2 mg/dL   GFR calc non Af Amer 44 (L) >60 mL/min   GFR calc Af Amer 52 (L) >60 mL/min   Anion gap 11 5 - 15    Comment: Performed at [facility], [address] [place], [place] [address]   Dg Chest 1 View  Result Date: 03/08/19 CLINICAL DATA:  Pt arrives via ems from [facility]. Ems reports pt fell From unknown cause without hitting head or any LOC. Hx of dementia at baseline. Outward rotation of the right hip with shortening noted on arrival. EXAM: CHEST  1 VIEW COMPARISON:  07/21/2017 FINDINGS: Stable changes from prior CABG surgery. Cardiac silhouette is mildly enlarged. Right anterior chest wall biventricular cardioverter-defibrillator is stable. No mediastinal or hilar masses. Bilateral apically pleural thickening with associated a pickle parenchymal scarring, unchanged. Lungs otherwise clear. No pleural effusion and no pneumothorax. Skeletal structures are  demineralized. No acute skeletal abnormality. IMPRESSION: No acute cardiopulmonary disease. Electronically Signed   By: Amie Portlandavid  Ormond M.D.   On: 2020/08/1918 19:52   Dg Hip Unilat W Or Wo Pelvis 2-3 Views Right  Result Date: 03/08/19 CLINICAL DATA:  Fall with right hip pain and external rotation of the right leg. EXAM: DG HIP (WITH OR WITHOUT  PELVIS) 2-3V RIGHT COMPARISON:  None. FINDINGS: There is a comminuted, displaced intertrochanteric fracture of the proximal right femur. The primary proximal fracture component is displaced from the distal fracture component by 1.2 cm superiorly and laterally. There is also mild varus angulation. There separate fractures of the greater and lesser trochanters. There are no other fractures.  No bone lesions. Hip joints, SI joints and symphysis pubis are normally aligned. Skeletal structures are diffusely demineralized. IMPRESSION: Comminuted, mildly displaced intertrochanteric fracture of the proximal right femur. No dislocation. Electronically Signed   By: Lajean Manes M.D.   On: 09/02/2018 19:51    Pending Labs Unresulted Labs (From admission, onward)    Start     Ordered   09/08/2018 2125  SARS Coronavirus 2 (CEPHEID - Performed in [place] hospital lab), Hosp Order  (Asymptomatic Patients Labs)  Once,   STAT    Question:  Rule Out  Answer:  Yes   08/24/2018 2124   09/11/2018 1933  Urinalysis, Complete w Microscopic  ONCE - STAT,   STAT     08/22/2018 1933   Signed and Held  Basic metabolic panel  Tomorrow morning,   R     Signed and Held   Signed and Held  CBC  Tomorrow morning,   R     Signed and Held          Vitals/Pain Today's Vitals   08/21/2018 2030 08/25/2018 2100 08/21/2018 2130 09/02/2018 2200  BP: (!) 179/93 (!) 183/84 (!) 154/83 (!) 154/94  Pulse: 77 74 75 77  Resp: 18 (!) 21 18 16   Temp:      TempSrc:      SpO2: 98% 99% 95% 100%  Weight:      Height:      PainSc:        Isolation Precautions No active  isolations  Medications Medications  fentaNYL (SUBLIMAZE) injection 50 mcg (50 mcg Intravenous Given 08/27/2018 1949)  fentaNYL (SUBLIMAZE) injection 50 mcg (50 mcg Intravenous Given 08/18/2018 2105)  morphine 4 MG/ML injection 4 mg (4 mg Intravenous Given 09/12/2018 2152)    Mobility non-ambulatory High fall risk   Focused Assessments Cardiac Assessment Handoff:  Cardiac Rhythm: Ventricular paced Lab Results  Component Value Date   TROPONINI 0.94 ([place]) 03/16/2016   No results found for: DDIMER Does the Patient currently have chest pain? No  , Pulmonary Assessment Handoff:  Lung sounds:  clear bil O2 Device: Room Air   R leg shortening and external rotation.  Pt is alert to self only at baseline, hx dementia. Very hard of hearing.    R Recommendations: See Admitting Provider Note  Report given to:   Additional Notes:

## 2018-08-29 NOTE — ED Provider Notes (Signed)
[facility] Emergency Department Provider Note  ____________________________________________   I have reviewed the triage vital signs and the nursing notes. Where available I have reviewed prior notes and, if possible and indicated, outside hospital notes.    HISTORY  Chief Complaint Fall    HPI Vincent [name] is a 85 y.o. 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  Patient is DNR/DNI.  According to family, he used to be on Coumadin but no longer is.  Is complaining of right hip pain.  Patient is very hard of hearing.  Limited history.     Past Medical History:  Diagnosis Date  . AICD (automatic cardioverter/defibrillator) present   . Atrial fibrillation (HCC)   . CHF (congestive heart failure) (HCC)   . CKD (chronic kidney disease)   . Coronary artery disease   . GERD (gastroesophageal reflux disease)   . Presence of permanent cardiac pacemaker     Patient Active Problem List   Diagnosis Date Noted  . UTI (urinary tract infection) 03/16/2016  . Sepsis (HCC) 03/15/2016  . CAP (community acquired pneumonia) 03/15/2016  . Chronic systolic CHF (congestive heart failure) (HCC) 03/15/2016  . Atrial fibrillation (HCC) 03/15/2016  . GERD (gastroesophageal reflux disease) 03/15/2016  . CAD (coronary artery disease) 03/15/2016  . Cellulitis 12/10/2015    Past Surgical History:  Procedure Laterality Date  . CATARACT EXTRACTION    . CORONARY ARTERY BYPASS GRAFT    . HERNIA REPAIR      Prior to Admission medications   Medication Sig Start Date End Date Taking? Authorizing Provider  ALPRAZolam Prudy Feeler(XANAX) 0.25 MG tablet Take 0.25 mg by mouth at bedtime as needed for sleep.   Yes [provider]  bumetanide (BUMEX) 0.5 MG tablet Take 0.5 mg by mouth daily.   Yes [provider]  divalproex  (DEPAKOTE) 125 MG DR tablet Take 125 mg by mouth 2 (two) times daily as needed (agitation).   Yes [provider]  furosemide (LASIX) 40 MG tablet Take 1 tablet by mouth 2 (two) times daily. 04/16/15  Yes [provider]  galantamine (RAZADYNE) 4 MG tablet Take 1 tablet (4 mg total) by mouth daily. 03/19/16  Yes Auburn BilberryPatel, Shreyang, MD  isosorbide dinitrate (ISORDIL) 30 MG tablet Take 30 mg by mouth every morning.   Yes [provider]  potassium chloride (KLOR-CON) 20 MEQ packet Take 20 mEq by mouth daily. 03/19/16  Yes Auburn BilberryPatel, Shreyang, MD  simvastatin (ZOCOR) 40 MG tablet Take 1 tablet by mouth every evening.    Yes [provider]  warfarin (COUMADIN) 4 MG tablet Take 1 tablet by mouth every evening.    Yes [provider]  benzonatate (TESSALON) 200 MG capsule Take 1 capsule (200 mg total) by mouth 3 (three) times daily as needed for cough. Patient not taking: Reported on 07/21/2017 03/19/16   Auburn BilberryPatel, Shreyang, MD  docusate sodium (COLACE) 100 MG capsule Take 1 capsule (100 mg total) by mouth 2 (two) times daily. Patient not taking: Reported on 03/15/2016 12/13/15   Hower, Cletis Athensavid K, MD  guaiFENesin-dextromethorphan (ROBITUSSIN DM) 100-10 MG/5ML syrup Take 5 mLs by mouth every 4 (four) hours as needed for cough. Patient not taking: Reported on 07/21/2017 03/19/16   Auburn BilberryPatel, Shreyang, MD  polyethylene glycol [facility](MIRALAX / Ethelene HalGLYCOLAX) packet Take 17 g by mouth daily as needed for mild constipation or moderate constipation.  Patient not taking: Reported on 03/15/2016 12/13/15   Hower, Cletis Athensavid K, MD    Allergies Atorvastatin, Codeine, and Meperidine  Family History  Problem Relation Age of Onset  . Heart attack Mother   . Heart disease Neg Hx   . Diabetes Neg Hx     Social History Social History   Tobacco Use  . Smoking status: Never Smoker  . Smokeless tobacco: Never Used  Substance Use Topics  . Alcohol use: No    Alcohol/week: 0.0 standard drinks  . Drug use: No     Review of Systems Constitutional: No fever/chills Eyes: No visual changes. ENT: No sore throat. No stiff neck no neck pain Cardiovascular: Denies chest pain. Respiratory: Denies shortness of breath. Gastrointestinal:   no vomiting.  No diarrhea.  No constipation. Genitourinary: Negative for dysuria. Musculoskeletal: Negative lower extremity swelling Skin: Negative for rash. Neurological: Negative for severe headaches, focal weakness or numbness.   ____________________________________________   PHYSICAL EXAM:  VITAL SIGNS: ED Triage Vitals  Enc Vitals Group     BP 09/14/2018 1906 (!) 171/91     Pulse Rate 09/03/2018 1906 75     Resp 09/10/2018 1906 16     Temp 08/28/2018 1906 97.6 F (36.4 C)     Temp Source 09/03/2018 1906 Oral     SpO2 08/21/2018 1906 95 %     Weight 09/02/2018 1908 195 lb (88.5 kg)     Height 09/04/2018 1908 6' (1.829 m)     Head Circumference --      Peak Flow --      Pain Score 08/25/2018 2010 Asleep     Pain Loc --      Pain Edu? --      Excl. in GC? --     Constitutional: Alert and oriented. Well appearing and in no acute distress. Eyes: Conjunctivae are normal Head: Atraumatic HEENT: No congestion/rhinnorhea. Mucous membranes are moist.  Oropharynx non-erythematous Neck:   Nontender with no meningismus, no masses, no stridor Cardiovascular: Normal rate, regular rhythm. Grossly normal heart sounds.  Good peripheral circulation. Respiratory: Normal respiratory effort.  No retractions. Lungs CTAB. Abdominal: Soft and nontender. No distention. No guarding no rebound Back:  There is no focal tenderness or step off.  there is no midline tenderness there are no lesions noted. there is no CVA tenderness Musculoskeletal: There is to be clinically a hip fracture not open, pain to palpation of the right hip, no knee or ankle pain.  No upper extremity tenderness. No joint effusions, no DVT signs strong distal pulses comparing bilateral lower edema edema Neurologic:   Normal speech and language. No gross focal neurologic deficits are appreciated.  Skin:  Skin is warm, dry and intact. No rash noted. Psychiatric: Mood and affect are normal. Speech and behavior are normal.  ____________________________________________   LABS (all labs ordered are listed, but only abnormal results are displayed)  Labs Reviewed  CBC WITH DIFFERENTIAL/PLATELET - Abnormal; Notable for the following components:      Result Value   Platelets 114 (*)    All other components within normal limits  COMPREHENSIVE METABOLIC PANEL - Abnormal; Notable for the following components:   Glucose, Bld 160 (*)    BUN 27 (*)    Creatinine, Ser 1.33 (*)    Calcium 8.6 (*)    Alkaline Phosphatase 133 (*)    GFR calc non Af Amer 44 (*)    GFR calc Af Amer 52 (*)    All other components within  normal limits  PROTIME-INR  URINALYSIS, COMPLETE (UACMP) WITH MICROSCOPIC  TYPE AND SCREEN    Pertinent labs  results that were available during my care of the patient were reviewed by me and considered in my medical decision making (see chart for details). ____________________________________________  EKG  I personally interpreted any EKGs ordered by me or triage  ____________________________________________  RADIOLOGY  Pertinent labs & imaging results that were available during my care of the patient were reviewed by me and considered in my medical decision making (see chart for details). If possible, patient and/or family made aware of any abnormal findings.  Dg Chest 1 View  Result Date: 09/07/2018 CLINICAL DATA:  Pt arrives via ems from [facility]. Ems reports pt fell From unknown cause without hitting head or any LOC. Hx of dementia at baseline. Outward rotation of the right hip with shortening noted on arrival. EXAM: CHEST  1 VIEW COMPARISON:  07/21/2017 FINDINGS: Stable changes from prior CABG surgery. Cardiac silhouette is mildly enlarged. Right anterior chest wall  biventricular cardioverter-defibrillator is stable. No mediastinal or hilar masses. Bilateral apically pleural thickening with associated a pickle parenchymal scarring, unchanged. Lungs otherwise clear. No pleural effusion and no pneumothorax. Skeletal structures are demineralized. No acute skeletal abnormality. IMPRESSION: No acute cardiopulmonary disease. Electronically Signed   By: Lajean Manes M.D.   On: 09/08/2018 19:52   Dg Hip Unilat W Or Wo Pelvis 2-3 Views Right  Result Date: 09/11/2018 CLINICAL DATA:  Fall with right hip pain and external rotation of the right leg. EXAM: DG HIP (WITH OR WITHOUT PELVIS) 2-3V RIGHT COMPARISON:  None. FINDINGS: There is a comminuted, displaced intertrochanteric fracture of the proximal right femur. The primary proximal fracture component is displaced from the distal fracture component by 1.2 cm superiorly and laterally. There is also mild varus angulation. There separate fractures of the greater and lesser trochanters. There are no other fractures.  No bone lesions. Hip joints, SI joints and symphysis pubis are normally aligned. Skeletal structures are diffusely demineralized. IMPRESSION: Comminuted, mildly displaced intertrochanteric fracture of the proximal right femur. No dislocation. Electronically Signed   By: Lajean Manes M.D.   On: 09/08/2018 19:51   ____________________________________________    PROCEDURES  Procedure(s) performed: None  Procedures  Critical Care performed: None  ____________________________________________   INITIAL IMPRESSION / ASSESSMENT AND PLAN / ED COURSE  Pertinent labs & imaging results that were available during my care of the patient were reviewed by me and considered in my medical decision making (see chart for details).  Patient with a non-syncopal fall no evidence of close injury and, reportedly no longer on Coumadin INR is reassuring, has a broken hip, did discuss with orthopedic surgery, Dr. Donivan Scull, and also  Dr. Jannifer Franklin who will admit.  I also talked to the family, at that the date on findings.  I did not like not to scan his head.  Is reported that he did not his head he is at his baseline by all accounts, and it causes him great pain to move around I do not think putting him to the agony of transfer to the CT scan is medically indicated at this time.  Family agrees.    ____________________________________________   FINAL CLINICAL IMPRESSION(S) / ED DIAGNOSES  Final diagnoses:  None      This chart was dictated using voice recognition software.  Despite best efforts to proofread,  errors can occur which can change meaning.      Schuyler Amor,  MD 09/09/2018 2117

## 2018-08-29 NOTE — Consult Note (Signed)
ANTICOAGULATION CONSULT NOTE - Initial Consult  Pharmacy Consult for Heparin dosing and monitoring  Indication: atrial fibrillation  Allergies  Allergen Reactions  . Atorvastatin     Other reaction(s): Unknown  . Codeine Nausea Only  . Meperidine Nausea Only   Patient Measurements: Height: 6' (182.9 cm) Weight: 195 lb (88.5 kg) IBW/kg (Calculated) : 77.6  Vital Signs: Temp: 97.6 F (36.4 C) (06/12 1906) Temp Source: Oral (06/12 1906) BP: 154/94 (06/12 2200) Pulse Rate: 76 (06/12 2220)  Labs: Recent Labs    22-Sep-2018 1959  HGB 13.3  HCT 40.5  PLT 114*  LABPROT 14.9  INR 1.2  CREATININE 1.33*    Estimated Creatinine Clearance: 34.8 mL/min (A) (by C-G formula based on SCr of 1.33 mg/dL (H)).   Medical History: Past Medical History:  Diagnosis Date  . AICD (automatic cardioverter/defibrillator) present   . Atrial fibrillation ([place])   . CHF (congestive heart failure) ([place])   . CKD (chronic kidney disease)   . Coronary artery disease   . GERD (gastroesophageal reflux disease)   . Presence of permanent cardiac pacemaker     Assessment: Pharmacy consulted for heparin infusion dosing and monitoring for 85 yo male admitted with right femur fracture after a fall. Patient has hx of A. Fib and has permanent cardiac pacemaker that is reported to not be working correctly. Patient was previously on warfarin but was recently taken off. INR on admission 1.2, aPTT 31.  Goal of Therapy:  Heparin level 0.3-0.7 units/ml Monitor platelets by anticoagulation protocol: Yes   Plan:  Baseline labs ordered, Plts: 114.  Give 4000 units bolus x 1 Start heparin infusion at 1200 units/hr Check anti-Xa level in 8 hours and daily while on heparin Continue to monitor H&H and platelets  Pernell Dupre, PharmD, BCPS Clinical Pharmacist 09-22-18 10:37 PM

## 2018-08-29 NOTE — Consult Note (Signed)
ORTHOPAEDIC CONSULTATION  PATIENT NAME: Vincent [name] DOB: 01-Jan-1920  MRN: [id number]  REQUESTING PHYSICIAN: Lance Coon, MD  Chief Complaint: Right hip intertrochanteric fracture  HPI: Vincent [name] is a 85 y.o. male who complains of right hip pain after the recent fall.  Patient was ambulating using a cane when he fell at home.  According to the history obtained in the ER patient lives independently.  He was brought to the ER [facility] where x-rays showed a displaced right hip intertrochanteric fracture.  Patient has significant comorbidities with history of congestive heart failure with a defibrillator in place as well as history of dementia and chronic kidney disease.  The service has been consulted for management of the right hip intratrochanteric fracture.  Past Medical History:  Diagnosis Date  . AICD (automatic cardioverter/defibrillator) present   . Atrial fibrillation ([place])   . CHF (congestive heart failure) ([place])   . CKD (chronic kidney disease)   . Coronary artery disease   . GERD (gastroesophageal reflux disease)   . Presence of permanent cardiac pacemaker    Past Surgical History:  Procedure Laterality Date  . CATARACT EXTRACTION    . CORONARY ARTERY BYPASS GRAFT    . HERNIA REPAIR     Social History   Socioeconomic History  . Marital status: Widowed    Spouse name: Not on file  . Number of children: Not on file  . Years of education: Not on file  . Highest education level: Not on file  Occupational History  . Not on file  Social Needs  . Financial resource strain: Not on file  . Food insecurity    Worry: Not on file    Inability: Not on file  . Transportation needs    Medical: Not on file    Non-medical: Not on file  Tobacco Use  . Smoking status: Never Smoker  . Smokeless tobacco: Never Used  Substance and Sexual Activity  . Alcohol use: No    Alcohol/week: 0.0 standard drinks  . Drug use: No  . Sexual activity: Not  on file  Lifestyle  . Physical activity    Days per week: Not on file    Minutes per session: Not on file  . Stress: Not on file  Relationships  . Social Herbalist on phone: Not on file    Gets together: Not on file    Attends religious service: Not on file    Active member of club or organization: Not on file    Attends meetings of clubs or organizations: Not on file    Relationship status: Not on file  Other Topics Concern  . Not on file  Social History Narrative  . Not on file   Family History  Problem Relation Age of Onset  . Heart attack Mother   . Heart disease Neg Hx   . Diabetes Neg Hx    Allergies  Allergen Reactions  . Atorvastatin     Other reaction(s): Unknown  . Codeine Nausea Only  . Meperidine Nausea Only   Prior to Admission medications   Medication Sig Start Date End Date Taking? Authorizing Provider  ALPRAZolam Duanne Moron) 0.25 MG tablet Take 0.25 mg by mouth at bedtime as needed for sleep.   Yes [provider]  bumetanide (BUMEX) 0.5 MG tablet Take 0.5 mg by mouth daily.   Yes [provider]  divalproex (DEPAKOTE) 125 MG DR tablet Take 125 mg by mouth 2 (two) times  daily as needed (agitation).   Yes [provider]  furosemide (LASIX) 40 MG tablet Take 1 tablet by mouth 2 (two) times daily. 04/16/15  Yes [provider]  galantamine (RAZADYNE) 4 MG tablet Take 1 tablet (4 mg total) by mouth daily. 03/19/16  Yes Auburn BilberryPatel, Shreyang, MD  isosorbide dinitrate (ISORDIL) 30 MG tablet Take 30 mg by mouth every morning.   Yes [provider]  potassium chloride (KLOR-CON) 20 MEQ packet Take 20 mEq by mouth daily. 03/19/16  Yes Auburn BilberryPatel, Shreyang, MD  simvastatin (ZOCOR) 40 MG tablet Take 1 tablet by mouth every evening.    Yes [provider]  warfarin (COUMADIN) 4 MG tablet Take 1 tablet by mouth every evening.    Yes [provider]  benzonatate (TESSALON) 200 MG capsule Take 1 capsule (200 mg total)  by mouth 3 (three) times daily as needed for cough. Patient not taking: Reported on 07/21/2017 03/19/16   Auburn BilberryPatel, Shreyang, MD  docusate sodium (COLACE) 100 MG capsule Take 1 capsule (100 mg total) by mouth 2 (two) times daily. Patient not taking: Reported on 03/15/2016 12/13/15   Hower, Cletis Athensavid K, MD  guaiFENesin-dextromethorphan (ROBITUSSIN DM) 100-10 MG/5ML syrup Take 5 mLs by mouth every 4 (four) hours as needed for cough. Patient not taking: Reported on 07/21/2017 03/19/16   Auburn BilberryPatel, Shreyang, MD  polyethylene glycol [facility](MIRALAX / Ethelene HalGLYCOLAX) packet Take 17 g by mouth daily as needed for mild constipation or moderate constipation. Patient not taking: Reported on 03/15/2016 12/13/15   Hower, Cletis Athensavid K, MD   Dg Chest 1 View  Result Date: 2018-08-03 CLINICAL DATA:  Pt arrives via ems from [facility]. Ems reports pt fell From unknown cause without hitting head or any LOC. Hx of dementia at baseline. Outward rotation of the right hip with shortening noted on arrival. EXAM: CHEST  1 VIEW COMPARISON:  07/21/2017 FINDINGS: Stable changes from prior CABG surgery. Cardiac silhouette is mildly enlarged. Right anterior chest wall biventricular cardioverter-defibrillator is stable. No mediastinal or hilar masses. Bilateral apically pleural thickening with associated a pickle parenchymal scarring, unchanged. Lungs otherwise clear. No pleural effusion and no pneumothorax. Skeletal structures are demineralized. No acute skeletal abnormality. IMPRESSION: No acute cardiopulmonary disease. Electronically Signed   By: Amie Portlandavid  Ormond M.D.   On: 02020-05-17 19:52   Dg Hip Unilat W Or Wo Pelvis 2-3 Views Right  Result Date: 2018-08-03 CLINICAL DATA:  Fall with right hip pain and external rotation of the right leg. EXAM: DG HIP (WITH OR WITHOUT PELVIS) 2-3V RIGHT COMPARISON:  None. FINDINGS: There is a comminuted, displaced intertrochanteric fracture of the proximal right femur. The primary proximal fracture component is  displaced from the distal fracture component by 1.2 cm superiorly and laterally. There is also mild varus angulation. There separate fractures of the greater and lesser trochanters. There are no other fractures.  No bone lesions. Hip joints, SI joints and symphysis pubis are normally aligned. Skeletal structures are diffusely demineralized. IMPRESSION: Comminuted, mildly displaced intertrochanteric fracture of the proximal right femur. No dislocation. Electronically Signed   By: Amie Portlandavid  Ormond M.D.   On: 02020-05-17 19:51    Positive ROS: All other systems have been reviewed and were otherwise negative with the exception of those mentioned in the HPI and as above.  Physical Exam: General: Well developed, well nourished male seen in no acute distress. HEENT: Atraumatic and normocephalic. Sclera are clear. Extraocular motion is intact. Oropharynx is clear with moist mucosa. Neck: Supple, nontender, good range of  motion. No JVD or carotid bruits. Lungs: Clear to auscultation bilaterally. Cardiovascular: Regular rate and rhythm with normal S1 and S2. No significant pretibial or ankle edema. Abdomen: Soft, nontender, and nondistended. Bowel sounds are present. Skin: No lesions in the area of chief complaint Neurologic: Patient is awake and responds to vocal commands but is not alert or oriented in time and place.. Lymphatic: No axillary or cervical lymphadenopathy  MUSCULOSKELETAL: There is pain with logroll of right lower extremity.  He maintains spontaneous dorsiflexion and plantarflexion of his ankle.  Assessment: 85 years old male with significant comorbidities and right hip intertrochanteric fracture  Plan: 85 years old male with displaced right hip intertrochanteric fracture.  Patient is significantly high surgical risk.  His comorbidities include significant history of coronary artery disease, atrial fibrillation, defibrillator placement, chronic kidney disease and history of dementia.   According to the hospitalist not clear if patient is using anticoagulation although his INR is 1.2.  Based on the fact that patient was ambulatory before his fall patient is a candidate for right hip intramedullary nailing.  Patient will need preop optimization and clearance as well as a discussion with the family if they would like to proceed with surgical intervention.  Patient will be placed on the surgical schedule tentatively for Sunday, 08/01/2018.  Garnette GunnerShakeel Johannes Everage, M.D.

## 2018-08-30 ENCOUNTER — Other Ambulatory Visit: Payer: Self-pay

## 2018-08-30 LAB — CBC
HCT: 40 % (ref 39.0–52.0)
Hemoglobin: 13 g/dL (ref 13.0–17.0)
MCH: 31.8 pg (ref 26.0–34.0)
MCHC: 32.5 g/dL (ref 30.0–36.0)
MCV: 97.8 fL (ref 80.0–100.0)
Platelets: 117 10*3/uL — ABNORMAL LOW (ref 150–400)
RBC: 4.09 MIL/uL — ABNORMAL LOW (ref 4.22–5.81)
RDW: 12.4 % (ref 11.5–15.5)
WBC: 12.8 10*3/uL — ABNORMAL HIGH (ref 4.0–10.5)
nRBC: 0 % (ref 0.0–0.2)

## 2018-08-30 LAB — BASIC METABOLIC PANEL
Anion gap: 12 (ref 5–15)
BUN: 25 mg/dL — ABNORMAL HIGH (ref 8–23)
CO2: 24 mmol/L (ref 22–32)
Calcium: 8.7 mg/dL — ABNORMAL LOW (ref 8.9–10.3)
Chloride: 105 mmol/L (ref 98–111)
Creatinine, Ser: 1.33 mg/dL — ABNORMAL HIGH (ref 0.61–1.24)
GFR calc Af Amer: 52 mL/min — ABNORMAL LOW (ref >60)
GFR calc non Af Amer: 44 mL/min — ABNORMAL LOW (ref >60)
Glucose, Bld: 142 mg/dL — ABNORMAL HIGH (ref 70–99)
Potassium: 4 mmol/L (ref 3.5–5.1)
Sodium: 141 mmol/L (ref 135–145)

## 2018-08-30 LAB — MRSA PCR SCREENING: MRSA by PCR: POSITIVE — AB

## 2018-08-30 MED ORDER — MUPIROCIN 2 % EX OINT
1.0000 "application " | TOPICAL_OINTMENT | Freq: Two times a day (BID) | CUTANEOUS | Status: DC
  Administered 2018-08-30 – 2018-08-31 (×4): 1 via NASAL
  Filled 2018-08-30 (×2): qty 22

## 2018-08-30 MED ORDER — HEPARIN SODIUM (PORCINE) 5000 UNIT/ML IJ SOLN
5000.0000 [IU] | Freq: Three times a day (TID) | INTRAMUSCULAR | Status: DC
  Administered 2018-08-30: 5000 [IU] via SUBCUTANEOUS
  Filled 2018-08-30 (×2): qty 1

## 2018-08-30 MED ORDER — CHLORHEXIDINE GLUCONATE CLOTH 2 % EX PADS
6.0000 | MEDICATED_PAD | Freq: Every day | CUTANEOUS | Status: DC
  Administered 2018-08-30 – 2018-09-01 (×3): 6 via TOPICAL

## 2018-08-30 NOTE — Progress Notes (Signed)
[facility] - [place] at [facility]                                                                                                                                                                                  Patient Demographics   Vincent [name], is a 85 y.o. male, DOB - October 27, 1919, AVW:[id number]  Admit date - 08/23/2018   Admitting Physician [name], MD  Outpatient Primary MD for the patient is System, Pcp Not In   LOS - 1  Subjective: Patient complains of pain in the hip very hard to understand him    Review of Systems:   CONSTITUTIONAL: Difficulty to understand patient due to hard of hearing  Vitals:   Vitals:   09/02/2018 2300 08/30/18 0019 08/30/18 0500 08/30/18 0750  BP: (!) 152/88 124/73  120/65  Pulse: 77 77  77  Resp: (!) 25 20  20   Temp:  97.6 F (36.4 C)  97.7 F (36.5 C)  TempSrc:  Oral  Oral  SpO2: 100% 98%  100%  Weight:   88.9 kg   Height:        Wt Readings from Last 3 Encounters:  08/30/18 88.9 kg  05/07/18 94.5 kg  03/20/16 94.5 kg     Intake/Output Summary (Last 24 hours) at 08/30/2018 1258 Last data filed at 08/30/2018 0500 Gross per 24 hour  Intake -  Output 400 ml  Net -400 ml    Physical Exam:   GENERAL: Pleasant-appearing in no apparent distress.  HEAD, EYES, EARS, NOSE AND THROAT: Atraumatic, normocephalic. Extraocular muscles are intact. Pupils equal and reactive to light. Sclerae anicteric. No conjunctival injection. No oro-pharyngeal erythema.  NECK: Supple. There is no jugular venous distention. No bruits, no lymphadenopathy, no thyromegaly.  HEART: Regular rate and rhythm,. No murmurs, no rubs, no clicks.  LUNGS: Clear to auscultation bilaterally. No rales or rhonchi. No wheezes.  ABDOMEN: Soft, flat, nontender, nondistended. Has good bowel sounds. No hepatosplenomegaly appreciated.  EXTREMITIES: No evidence of any cyanosis, clubbing, or peripheral edema.  +2 pedal and radial pulses bilaterally.   NEUROLOGIC: The patient is alert, no focal deficits noted SKIN: Moist and warm with no rashes appreciated.  Psych: Not anxious, depressed LN: No inguinal LN enlargement    Antibiotics   Anti-infectives (From admission, onward)   None      Medications   Scheduled Meds: . bumetanide  0.5 mg Oral Daily  . Chlorhexidine Gluconate Cloth  6 each Topical Q0600  . mupirocin ointment  1 application Nasal BID  . simvastatin  40 mg Oral QPM   Continuous Infusions: PRN Meds:.acetaminophen **OR** acetaminophen, ALPRAZolam, divalproex, morphine injection, ondansetron **OR** ondansetron (ZOFRAN) IV,  oxyCODONE   Data Review:   Micro Results Recent Results (from the past 240 hour(s))  SARS Coronavirus 2 (CEPHEID - Performed in [facility] hospital lab), Hosp Order     Status: None   Collection Time: 09/03/2018  9:40 PM   Specimen: Nasopharyngeal Swab  Result Value Ref Range Status   SARS Coronavirus 2 NEGATIVE NEGATIVE Final    Comment: (NOTE) If result is NEGATIVE SARS-CoV-2 target nucleic acids are NOT DETECTED. The SARS-CoV-2 RNA is generally detectable in upper and lower  respiratory specimens during the acute phase of infection. The lowest  concentration of SARS-CoV-2 viral copies this assay can detect is 250  copies / mL. A negative result does not preclude SARS-CoV-2 infection  and should not be used as the sole basis for treatment or other  patient management decisions.  A negative result may occur with  improper specimen collection / handling, submission of specimen other  than nasopharyngeal swab, presence of viral mutation(s) within the  areas targeted by this assay, and inadequate number of viral copies  (<250 copies / mL). A negative result must be combined with clinical  observations, patient history, and epidemiological information. If result is POSITIVE SARS-CoV-2 target nucleic acids are DETECTED. The SARS-CoV-2 RNA is generally detectable in upper and lower   respiratory specimens dur ing the acute phase of infection.  Positive  results are indicative of active infection with SARS-CoV-2.  Clinical  correlation with patient history and other diagnostic information is  necessary to determine patient infection status.  Positive results do  not rule out bacterial infection or co-infection with other viruses. If result is PRESUMPTIVE POSTIVE SARS-CoV-2 nucleic acids MAY BE PRESENT.   A presumptive positive result was obtained on the submitted specimen  and confirmed on repeat testing.  While 2019 novel coronavirus  (SARS-CoV-2) nucleic acids may be present in the submitted sample  additional confirmatory testing may be necessary for epidemiological  and / or clinical management purposes  to differentiate between  SARS-CoV-2 and other Sarbecovirus currently known to infect humans.  If clinically indicated additional testing with an alternate test  methodology [phone]([id number]) is advised. The SARS-CoV-2 RNA is generally  detectable in upper and lower respiratory sp ecimens during the acute  phase of infection. The expected result is Negative. Fact Sheet for Patients:  BoilerBrush.com.cyhttps://www.fda.gov/media/136312/download Fact Sheet for Healthcare Providers: https://pope.com/https://www.fda.gov/media/136313/download This test is not yet approved or cleared by the [place] FDA and has been authorized for detection and/or diagnosis of SARS-CoV-2 by FDA under an Emergency Use Authorization (EUA).  This EUA will remain in effect (meaning this test can be used) for the duration of the COVID-19 declaration under Section 564(b)(1) of the Act, 21 U.S.C. section 360bbb-3(b)(1), unless the authorization is terminated or revoked sooner. Performed at [facility], [address] [place], [place] [address]   MRSA PCR Screening     Status: Abnormal   Collection Time: 08/30/18 12:16 AM   Specimen: Nasal Mucosa; Nasopharyngeal  Result Value Ref Range Status   MRSA by PCR  POSITIVE (A) NEGATIVE Final    Comment:        The GeneXpert MRSA Assay (FDA approved for NASAL specimens only), is one component of a comprehensive MRSA colonization surveillance program. It is not intended to diagnose MRSA infection nor to guide or monitor treatment for MRSA infections. CRITICAL RESULT CALLED TO, READ BACK BY AND VERIFIED WITH: OLIVE KIBERENGE @150  08/30/2018 TTG  Performed at [facility], [address] [place],  [place] [address]     Radiology Reports Dg Chest 1 View  Result Date: 2019/02/15 CLINICAL DATA:  Pt arrives via ems from [facility]. Ems reports pt fell From unknown cause without hitting head or any LOC. Hx of dementia at baseline. Outward rotation of the right hip with shortening noted on arrival. EXAM: CHEST  1 VIEW COMPARISON:  07/21/2017 FINDINGS: Stable changes from prior CABG surgery. Cardiac silhouette is mildly enlarged. Right anterior chest wall biventricular cardioverter-defibrillator is stable. No mediastinal or hilar masses. Bilateral apically pleural thickening with associated a pickle parenchymal scarring, unchanged. Lungs otherwise clear. No pleural effusion and no pneumothorax. Skeletal structures are demineralized. No acute skeletal abnormality. IMPRESSION: No acute cardiopulmonary disease. Electronically Signed   By: Amie Portlandavid  Ormond M.D.   On: 02020/11/29 19:52   Dg Hip Unilat W Or Wo Pelvis 2-3 Views Right  Result Date: 2019/02/15 CLINICAL DATA:  Fall with right hip pain and external rotation of the right leg. EXAM: DG HIP (WITH OR WITHOUT PELVIS) 2-3V RIGHT COMPARISON:  None. FINDINGS: There is a comminuted, displaced intertrochanteric fracture of the proximal right femur. The primary proximal fracture component is displaced from the distal fracture component by 1.2 cm superiorly and laterally. There is also mild varus angulation. There separate fractures of the greater and lesser trochanters. There are no other  fractures.  No bone lesions. Hip joints, SI joints and symphysis pubis are normally aligned. Skeletal structures are diffusely demineralized. IMPRESSION: Comminuted, mildly displaced intertrochanteric fracture of the proximal right femur. No dislocation. Electronically Signed   By: Amie Portlandavid  Ormond M.D.   On: 02020/11/29 19:51     CBC Recent Labs  Lab 12-09-2018 1959 08/30/18 0509  WBC 10.0 12.8*  HGB 13.3 13.0  HCT 40.5 40.0  PLT 114* 117*  MCV 95.7 97.8  MCH 31.4 31.8  MCHC 32.8 32.5  RDW 12.4 12.4  LYMPHSABS 1.9  --   MONOABS 0.8  --   EOSABS 0.1  --   BASOSABS 0.0  --     Chemistries  Recent Labs  Lab 12-09-2018 1959 08/30/18 0509  NA 140 141  K 3.8 4.0  CL 105 105  CO2 24 24  GLUCOSE 160* 142*  BUN 27* 25*  CREATININE 1.33* 1.33*  CALCIUM 8.6* 8.7*  AST 18  --   ALT 13  --   ALKPHOS 133*  --   BILITOT 0.7  --    ------------------------------------------------------------------------------------------------------------------ estimated creatinine clearance is 34.8 mL/min (A) (by C-G formula based on SCr of 1.33 mg/dL (H)). ------------------------------------------------------------------------------------------------------------------ No results for input(s): HGBA1C in the last 72 hours. ------------------------------------------------------------------------------------------------------------------ No results for input(s): CHOL, HDL, LDLCALC, TRIG, CHOLHDL, LDLDIRECT in the last 72 hours. ------------------------------------------------------------------------------------------------------------------ No results for input(s): TSH, T4TOTAL, T3FREE, THYROIDAB in the last 72 hours.  Invalid input(s): FREET3 ------------------------------------------------------------------------------------------------------------------ No results for input(s): VITAMINB12, FOLATE, FERRITIN, TIBC, IRON, RETICCTPCT in the last 72 hours.  Coagulation profile Recent Labs  Lab  12-09-2018 1959  INR 1.2    No results for input(s): DDIMER in the last 72 hours.  Cardiac Enzymes No results for input(s): CKMB, TROPONINI, MYOGLOBIN in the last 168 hours.  Invalid input(s): CK ------------------------------------------------------------------------------------------------------------------ Invalid input(s): POCBNP    Assessment & Plan  Patient is [age] year old status post fall  Closed right hip fracture (HCC) -plan per surgery per orthopedics Await cardiac clearance  Chronic systolic CHF (congestive heart failure) (HCC) -continue home meds     Atrial fibrillation (HCC) -continue home medications.    Continue to monitor  CAD (coronary artery disease) -continue home meds   CKD (chronic kidney disease), stage III ([place]) -at baseline, avoid nephrotoxins and monitor       Code Status Orders  (From admission, onward)         Start     Ordered   08/26/2018 2348  Do not attempt resuscitation (DNR)  Continuous    Question Answer Comment  In the event of cardiac or respiratory ARREST Do not call a "code blue"   In the event of cardiac or respiratory ARREST Do not perform Intubation, CPR, defibrillation or ACLS   In the event of cardiac or respiratory ARREST Use medication by any route, position, wound care, and other measures to relive pain and suffering. May use oxygen, suction and manual treatment of airway obstruction as needed for comfort.      09/02/2018 2347        Code Status History    Date Active Date Inactive Code Status Order ID Comments User Context   03/15/2016 2340 03/20/2016 2027 Full Code [id number]  Lance Coon, MD Inpatient   12/10/2015 2005 12/13/2015 1104 Full Code [id number]  Henreitta Leber, MD Inpatient   06/03/2015 0438 06/03/2015 1503 Full Code [id number]  Fritzi Mandes, MD Inpatient   Advance Care Planning Activity    Advance Directive Documentation     Most Recent Value  Type of Advance Directive  Out of facility DNR (pink MOST or  yellow form)  Pre-existing out of facility DNR order (yellow form or pink MOST form)  Yellow form placed in chart (order not valid for inpatient use), Physician notified to receive inpatient order  "MOST" Form in Place?  -           Consults cardiology and orthopedics   DVT Prophylaxis SCDs until surgery is over Lab Results  Component Value Date   PLT 117 (L) 08/30/2018     Time Spent in minutes 35 minutes greater than 50% of time spent in care coordination and counseling patient regarding the condition and plan of care.   Dustin Flock M.D on 08/30/2018 at 12:58 PM  Between 7am to 6pm - Pager - [phone]  After 6pm go to [email or web address] - Proofreader  [employer] [facility]   Office  [phone]

## 2018-08-30 NOTE — Progress Notes (Signed)
Advanced care plan.  Purpose of the Encounter: CODE STATUS, further goal of care  Parties in Attendance: Patient's daughter Vincent [name]  Patient's Decision Capacity: Not intact  Subjective/Patient's story: Vincent [name]  is a 85 y.o. male history of pacemaker defibrillator, dementia, chronic kidney disease, coronary artery disease who presents with chief complaint as above.  Patient presents the ED after mechanical fall with right hip pain.  Imaging confirms right hip fracture.     Objective/Medical story  I discussed with patient's daughter regarding overall wishes for surgical intervention and further goals of care.  Also discussed regarding his CODE STATUS and healthcare power of attorney.  Goals of care determination:  Patient daughter states that she wants him to remain DNR.  Would want for him to have a hip fracture repaired.  Would not want any other aggressive measures.   CODE STATUS: DNR  Time spent discussing advanced care planning: 16 minutes

## 2018-08-30 NOTE — Consult Note (Signed)
Reason for Consult: Reason for consult preop for right hip surgery Referring Physician:  physician Dr. [name] hospitalist Cardiologist Dr. Phillips ClimesParaschos  Vincent [name] is an 85 y.o. male.  HPI: Patient is a 85 year old male from a facility reportedly had a witnessed fall possibly thought to be syncope.  Patient has sick sinus syndrome atrial fibrillation AICD permanent pacemaker in place patient reportedly had a syncopal episode injuring his right hip.  Patient is now preop for hip surgery cardiology was consulted for preoperative cardiac assessment and clearance  Past Medical History:  Diagnosis Date  . AICD (automatic cardioverter/defibrillator) present   . Atrial fibrillation (HCC)   . CHF (congestive heart failure) (HCC)   . CKD (chronic kidney disease)   . Coronary artery disease   . GERD (gastroesophageal reflux disease)   . Presence of permanent cardiac pacemaker     Past Surgical History:  Procedure Laterality Date  . CATARACT EXTRACTION    . CORONARY ARTERY BYPASS GRAFT    . HERNIA REPAIR      Family History  Problem Relation Age of Onset  . Heart attack Mother   . Heart disease Neg Hx   . Diabetes Neg Hx     Social History:  reports that he has never smoked. He has never used smokeless tobacco. He reports that he does not drink alcohol or use drugs.  Allergies:  Allergies  Allergen Reactions  . Atorvastatin     Other reaction(s): Unknown  . Codeine Nausea Only  . Meperidine Nausea Only    Medications: I have reviewed the patient's current medications.  Results for orders placed or performed during the hospital encounter of 2018-07-21 (from the past 48 hour(s))  CBC with Differential     Status: Abnormal   Collection Time: 2018-07-21  7:59 PM  Result Value Ref Range   WBC 10.0 4.0 - 10.5 K/uL   RBC 4.23 4.22 - 5.81 MIL/uL   Hemoglobin 13.3 13.0 - 17.0 g/dL   HCT 16.140.5 09.639.0 - 04.552.0 %   MCV 95.7 80.0 - 100.0 fL   MCH 31.4 26.0 - 34.0 pg   MCHC 32.8 30.0 -  36.0 g/dL   RDW 40.912.4 81.111.5 - 91.415.5 %   Platelets 114 (L) 150 - 400 K/uL    Comment: Immature Platelet Fraction may be clinically indicated, consider ordering this additional test [id number]    nRBC 0.0 0.0 - 0.2 %   Neutrophils Relative % 71 %   Neutro Abs 7.2 1.7 - 7.7 K/uL   Lymphocytes Relative 19 %   Lymphs Abs 1.9 0.7 - 4.0 K/uL   Monocytes Relative 8 %   Monocytes Absolute 0.8 0.1 - 1.0 K/uL   Eosinophils Relative 1 %   Eosinophils Absolute 0.1 0.0 - 0.5 K/uL   Basophils Relative 0 %   Basophils Absolute 0.0 0.0 - 0.1 K/uL   Immature Granulocytes 1 %   Abs Immature Granulocytes 0.05 0.00 - 0.07 K/uL    Comment: Performed at [facility], [address] [place], [place] [address]  Protime-INR     Status: None   Collection Time: 2018-07-21  7:59 PM  Result Value Ref Range   Prothrombin Time 14.9 11.4 - 15.2 seconds   INR 1.2 0.8 - 1.2    Comment: (NOTE) INR goal varies based on device and disease states. Performed at [facility], [address] [place], [place] [address]   Type and screen [facility]     Status:  None   Collection Time: 09/07/2018  7:59 PM  Result Value Ref Range   ABO/RH(D) O POS    Antibody Screen NEG    Sample Expiration      01-Oct-2018,2359 Performed at [facility], [address] [place], [place] [address]   Comprehensive metabolic panel     Status: Abnormal   Collection Time: 08/25/2018  7:59 PM  Result Value Ref Range   Sodium 140 135 - 145 mmol/L   Potassium 3.8 3.5 - 5.1 mmol/L   Chloride 105 98 - 111 mmol/L   CO2 24 22 - 32 mmol/L   Glucose, Bld 160 (H) 70 - 99 mg/dL   BUN 27 (H) 8 - 23 mg/dL   Creatinine, Ser 1.33 (H) 0.61 - 1.24 mg/dL   Calcium 8.6 (L) 8.9 - 10.3 mg/dL   Total Protein 6.7 6.5 - 8.1 g/dL   Albumin 3.8 3.5 - 5.0 g/dL   AST 18 15 - 41 U/L   ALT 13 0 - 44 U/L   Alkaline Phosphatase 133 (H) 38 - 126 U/L   Total Bilirubin 0.7 0.3 - 1.2 mg/dL   GFR calc non Af Amer 44 (L)  >60 mL/min   GFR calc Af Amer 52 (L) >60 mL/min   Anion gap 11 5 - 15    Comment: Performed at [facility], [address] [place], [place] [address]  APTT     Status: None   Collection Time: 08/26/2018  7:59 PM  Result Value Ref Range   aPTT 31 24 - 36 seconds    Comment: Performed at [facility], [address] [place], [place] [address]  Urinalysis, Complete w Microscopic     Status: Abnormal   Collection Time: 09/14/2018  9:40 PM  Result Value Ref Range   Color, Urine YELLOW (A) YELLOW   APPearance CLEAR (A) CLEAR   Specific Gravity, Urine 1.012 1.005 - 1.030   pH 6.0 5.0 - 8.0   Glucose, UA NEGATIVE NEGATIVE mg/dL   Hgb urine dipstick MODERATE (A) NEGATIVE   Bilirubin Urine NEGATIVE NEGATIVE   Ketones, ur 5 (A) NEGATIVE mg/dL   Protein, ur NEGATIVE NEGATIVE mg/dL   Nitrite NEGATIVE NEGATIVE   Leukocytes,Ua NEGATIVE NEGATIVE   RBC / HPF 11-20 0 - 5 RBC/hpf   WBC, UA 0-5 0 - 5 WBC/hpf   Bacteria, UA NONE SEEN NONE SEEN   Squamous Epithelial / LPF 0-5 0 - 5    Comment: Performed at [facility], [address] [place], [place] [address]  SARS Coronavirus 2 (CEPHEID - Performed in [place] hospital lab), Hosp Order     Status: None   Collection Time: 08/19/2018  9:40 PM   Specimen: Nasopharyngeal Swab  Result Value Ref Range   SARS Coronavirus 2 NEGATIVE NEGATIVE    Comment: (NOTE) If result is NEGATIVE SARS-CoV-2 target nucleic acids are NOT DETECTED. The SARS-CoV-2 RNA is generally detectable in upper and lower  respiratory specimens during the acute phase of infection. The lowest  concentration of SARS-CoV-2 viral copies this assay can detect is 250  copies / mL. A negative result does not preclude SARS-CoV-2 infection  and should not be used as the sole basis for treatment or other  patient management decisions.  A negative result may occur with  improper specimen collection / handling, submission of specimen other  than nasopharyngeal  swab, presence of viral mutation(s) within the  areas targeted by this assay, and inadequate number of viral  copies  (<250 copies / mL). A negative result must be combined with clinical  observations, patient history, and epidemiological information. If result is POSITIVE SARS-CoV-2 target nucleic acids are DETECTED. The SARS-CoV-2 RNA is generally detectable in upper and lower  respiratory specimens dur ing the acute phase of infection.  Positive  results are indicative of active infection with SARS-CoV-2.  Clinical  correlation with patient history and other diagnostic information is  necessary to determine patient infection status.  Positive results do  not rule out bacterial infection or co-infection with other viruses. If result is PRESUMPTIVE POSTIVE SARS-CoV-2 nucleic acids MAY BE PRESENT.   A presumptive positive result was obtained on the submitted specimen  and confirmed on repeat testing.  While 2019 novel coronavirus  (SARS-CoV-2) nucleic acids may be present in the submitted sample  additional confirmatory testing may be necessary for epidemiological  and / or clinical management purposes  to differentiate between  SARS-CoV-2 and other Sarbecovirus currently known to infect humans.  If clinically indicated additional testing with an alternate test  methodology [phone]) is advised. The SARS-CoV-2 RNA is generally  detectable in upper and lower respiratory sp ecimens during the acute  phase of infection. The expected result is Negative. Fact Sheet for Patients:  BoilerBrush.com.cy Fact Sheet for Healthcare Providers: https://pope.com/ This test is not yet approved or cleared by the [place] FDA and has been authorized for detection and/or diagnosis of SARS-CoV-2 by FDA under an Emergency Use Authorization (EUA).  This EUA will remain in effect (meaning this test can be used) for the duration of the COVID-19 declaration  under Section 564(b)(1) of the Act, 21 U.S.C. section 360bbb-3(b)(1), unless the authorization is terminated or revoked sooner. Performed at [facility], [address] [place], [place] [address]   MRSA PCR Screening     Status: Abnormal   Collection Time: 08/30/18 12:16 AM   Specimen: Nasal Mucosa; Nasopharyngeal  Result Value Ref Range   MRSA by PCR POSITIVE (A) NEGATIVE    Comment:        The GeneXpert MRSA Assay (FDA approved for NASAL specimens only), is one component of a comprehensive MRSA colonization surveillance program. It is not intended to diagnose MRSA infection nor to guide or monitor treatment for MRSA infections. CRITICAL RESULT CALLED TO, READ BACK BY AND VERIFIED WITH: OLIVE KIBERENGE  08/30/2018 TTG  Performed at [facility], [address] [place] [place], [place] [address]   Basic metabolic panel     Status: Abnormal   Collection Time: 08/30/18  5:09 AM  Result Value Ref Range   Sodium 141 135 - 145 mmol/L   Potassium 4.0 3.5 - 5.1 mmol/L   Chloride 105 98 - 111 mmol/L   CO2 24 22 - 32 mmol/L   Glucose, Bld 142 (H) 70 - 99 mg/dL   BUN 25 (H) 8 - 23 mg/dL   Creatinine, Ser 3.08 (H) 0.61 - 1.24 mg/dL   Calcium 8.7 (L) 8.9 - 10.3 mg/dL   GFR calc non Af Amer 44 (L) >60 mL/min   GFR calc Af Amer 52 (L) >60 mL/min   Anion gap 12 5 - 15    Comment: Performed at [facility], [address] [place], [place] [address]  CBC     Status: Abnormal   Collection Time: 08/30/18  5:09 AM  Result Value Ref Range   WBC 12.8 (H) 4.0 - 10.5 K/uL   RBC 4.09 (L) 4.22 - 5.81 MIL/uL   Hemoglobin  13.0 13.0 - 17.0 g/dL   HCT 09.840.0 11.939.0 - 14.752.0 %   MCV 97.8 80.0 - 100.0 fL   MCH 31.8 26.0 - 34.0 pg   MCHC 32.5 30.0 - 36.0 g/dL   RDW 82.912.4 56.211.5 - 13.015.5 %   Platelets 117 (L) 150 - 400 K/uL    Comment: Immature Platelet Fraction may be clinically indicated, consider ordering this additional test [id number] CONSISTENT WITH PREVIOUS RESULT     nRBC 0.0 0.0 - 0.2 %    Comment: Performed at [facility], [address] [place], [place] [address]    Dg Chest 1 View  Result Date: 09/12/2018 CLINICAL DATA:  Pt arrives via ems from [facility]. Ems reports pt fell From unknown cause without hitting head or any LOC. Hx of dementia at baseline. Outward rotation of the right hip with shortening noted on arrival. EXAM: CHEST  1 VIEW COMPARISON:  07/21/2017 FINDINGS: Stable changes from prior CABG surgery. Cardiac silhouette is mildly enlarged. Right anterior chest wall biventricular cardioverter-defibrillator is stable. No mediastinal or hilar masses. Bilateral apically pleural thickening with associated a pickle parenchymal scarring, unchanged. Lungs otherwise clear. No pleural effusion and no pneumothorax. Skeletal structures are demineralized. No acute skeletal abnormality. IMPRESSION: No acute cardiopulmonary disease. Electronically Signed   By: Amie Portlandavid  Ormond M.D.   On: 09/10/2018 19:52   Dg Hip Unilat W Or Wo Pelvis 2-3 Views Right  Result Date: 09/05/2018 CLINICAL DATA:  Fall with right hip pain and external rotation of the right leg. EXAM: DG HIP (WITH OR WITHOUT PELVIS) 2-3V RIGHT COMPARISON:  None. FINDINGS: There is a comminuted, displaced intertrochanteric fracture of the proximal right femur. The primary proximal fracture component is displaced from the distal fracture component by 1.2 cm superiorly and laterally. There is also mild varus angulation. There separate fractures of the greater and lesser trochanters. There are no other fractures.  No bone lesions. Hip joints, SI joints and symphysis pubis are normally aligned. Skeletal structures are diffusely demineralized. IMPRESSION: Comminuted, mildly displaced intertrochanteric fracture of the proximal right femur. No dislocation. Electronically Signed   By: Amie Portlandavid  Ormond M.D.   On: 08/28/2018 19:51    Review of Systems  Constitutional: Positive for  malaise/fatigue.  HENT: Negative.   Eyes: Negative.   Respiratory: Negative.   Cardiovascular: Negative.   Gastrointestinal: Negative.   Genitourinary: Negative.   Musculoskeletal: Positive for falls, joint pain and myalgias.  Skin: Negative.   Neurological: Positive for loss of consciousness.  Endo/Heme/Allergies: Negative.   Psychiatric/Behavioral: Negative.    Blood pressure 120/65, pulse 77, temperature 97.7 F (36.5 C), temperature source Oral, resp. rate 20, height 6' (1.829 m), weight 88.9 kg, SpO2 100 %. Physical Exam  Nursing note and vitals reviewed. Constitutional: He is oriented to person, place, and time. He appears well-developed and well-nourished.  HENT:  Head: Normocephalic and atraumatic.  Eyes: Pupils are equal, round, and reactive to light. Conjunctivae and EOM are normal.  Neck: Normal range of motion. Neck supple.  Cardiovascular: Normal rate, regular rhythm and normal heart sounds.  Respiratory: Effort normal and breath sounds normal.  GI: Soft. Bowel sounds are normal.  Musculoskeletal: Normal range of motion.  Neurological: He is alert and oriented to person, place, and time. He has normal reflexes.  Skin: Skin is warm and dry.  Psychiatric: He has a normal mood and affect.    Assessment/Plan: Preop hip surgery Atrial fibrillation Fall possibly syncope Permanent pacemaker in place GERD Hypertension Hyperlipidemia Congestive  heart failure Chronic renal insufficiency AICD in place . Plan Patient appears to be moderate surgical risk for hip surgery but acceptable Recommend proceed with surgical correction of the right hip fracture Continue permanent pacemaker AICD will consider interrogation as an outpatient Continue to avoid antiarrhythmic because of fall risk Agree with blood pressure control Agree with lipid management for hyperlipidemia Continue diuretic therapy for heart failure shortness of breath Agree with Xanax therapy for anxiety  symptoms    Leanor Voris D Lauralynn Loeb 08/30/2018, 2:03 PM

## 2018-08-30 NOTE — Anesthesia Preprocedure Evaluation (Addendum)
Anesthesia Evaluation  Patient identified by MRN, date of birth, ID band Patient awake    Reviewed: Allergy & Precautions, H&P , NPO status , Patient's Chart, lab work & pertinent test results, reviewed documented beta blocker date and time   History of Anesthesia Complications Negative for: history of anesthetic complications  Airway Mallampati: III  TM Distance: >3 FB Neck ROM: full    Dental  (+) Dental Advidsory Given, Poor Dentition, Missing   Pulmonary neg pulmonary ROS, former smoker,           Cardiovascular Exercise Tolerance: Good (-) hypertension(-) angina+ CAD, + Past MI, + Cardiac Stents, + CABG and +CHF  + dysrhythmias Atrial Fibrillation + pacemaker + Cardiac Defibrillator (-) Valvular Problems/Murmurs     Neuro/Psych PSYCHIATRIC DISORDERS Dementia negative neurological ROS     GI/Hepatic Neg liver ROS, GERD  ,  Endo/Other  negative endocrine ROS  Renal/GU CRFRenal disease  negative genitourinary   Musculoskeletal   Abdominal   Peds  Hematology negative hematology ROS (+)   Anesthesia Other Findings Past Medical History: No date: AICD (automatic cardioverter/defibrillator) present No date: Atrial fibrillation (HCC) No date: CHF (congestive heart failure) (HCC) No date: CKD (chronic kidney disease) No date: Coronary artery disease No date: GERD (gastroesophageal reflux disease) No date: Presence of permanent cardiac pacemaker   Reproductive/Obstetrics negative OB ROS                           Anesthesia Physical Anesthesia Plan  ASA: IV and emergent  Anesthesia Plan: General   Post-op Pain Management:    Induction: Intravenous  PONV Risk Score and Plan: 2 and Ondansetron, Dexamethasone and Treatment may vary due to age or medical condition  Airway Management Planned: LMA and Oral ETT  Additional Equipment:   Intra-op Plan:   Post-operative Plan: Extubation  in OR  Informed Consent: I have reviewed the patients History and Physical, chart, labs and discussed the procedure including the risks, benefits and alternatives for the proposed anesthesia with the patient or authorized representative who has indicated his/her understanding and acceptance.     Dental Advisory Given  Plan Discussed with: Anesthesiologist, CRNA and Surgeon  Anesthesia Plan Comments:         Anesthesia Quick Evaluation

## 2018-08-30 NOTE — Progress Notes (Signed)
ORTHOPAEDICS PROGRESS NOTE  PATIENT NAME: Vincent [name] DOB: [birth year]  MRN: [id number]  Status post right hip intertrochanteric fracture  Subjective: Complains of right hip pain.  Objective: Vital signs in last 24 hours: Temp:  [97.6 F (36.4 C)-97.7 F (36.5 C)] 97.7 F (36.5 C) (06/13 0750) Pulse Rate:  [74-79] 77 (06/13 0750) Resp:  [16-25] 20 (06/13 0750) BP: (120-183)/(65-94) 120/65 (06/13 0750) SpO2:  [93 %-100 %] 100 % (06/13 0750) Weight:  [88.5 kg-88.9 kg] 88.9 kg (06/13 0500) Patient is comfortable in bed. Intake/Output from previous day: 06/12 0701 - 06/13 0700 In: -  Out: 400 [Urine:400]  Recent Labs    08/26/2018 1959 08/30/18 0509  WBC 10.0 12.8*  HGB 13.3 13.0  HCT 40.5 40.0  PLT 114* 117*  K 3.8 4.0  CL 105 105  CO2 24 24  BUN 27* 25*  CREATININE 1.33* 1.33*  GLUCOSE 160* 142*  CALCIUM 8.6* 8.7*  INR 1.2  --     EXAM General: Currently sleeping Lungs: Normal breathing, no abnormal sounds Cardiac: normal rate, regular rhythm, normal S1, S2, no murmurs, rubs, clicks or gallops, normal rate and regular rhythm Right extremity: Pain with logroll. Neurologic: Grossly intact  Assessment: Right hip intertrochanteric fracture  Secondary diagnoses:   Plan: Patient is currently awaiting cardiac clearance.  If patient is cleared he would be scheduled for intramedullary nailing right hip intertrochanteric fracture for tomorrow possibly under spinal anesthesia.  Plan is to go Rehab after hospital stay. DVT Prophylaxis - Aspirin  Syd Manges. M.D.

## 2018-08-31 ENCOUNTER — Inpatient Hospital Stay: Payer: Medicare Other | Admitting: Anesthesiology

## 2018-08-31 ENCOUNTER — Encounter: Payer: Self-pay | Admitting: Anesthesiology

## 2018-08-31 ENCOUNTER — Inpatient Hospital Stay: Payer: Medicare Other

## 2018-08-31 ENCOUNTER — Encounter: Admission: EM | Disposition: E | Payer: Self-pay | Source: Skilled Nursing Facility | Attending: Internal Medicine

## 2018-08-31 DIAGNOSIS — J9601 Acute respiratory failure with hypoxia: Secondary | ICD-10-CM

## 2018-08-31 DIAGNOSIS — S72143A Displaced intertrochanteric fracture of unspecified femur, initial encounter for closed fracture: Secondary | ICD-10-CM | POA: Diagnosis present

## 2018-08-31 DIAGNOSIS — S72001A Fracture of unspecified part of neck of right femur, initial encounter for closed fracture: Secondary | ICD-10-CM

## 2018-08-31 HISTORY — PX: INTRAMEDULLARY (IM) NAIL INTERTROCHANTERIC: SHX5875

## 2018-08-31 LAB — BLOOD GAS, ARTERIAL
Acid-base deficit: 1.8 mmol/L (ref 0.0–2.0)
Bicarbonate: 24.7 mmol/L (ref 20.0–28.0)
Delivery systems: POSITIVE
Expiratory PAP: 5
FIO2: 0.3
MECHVT: 500 mL
Mode: POSITIVE
O2 Saturation: 97.9 %
Patient temperature: 37
RATE: 16 resp/min
pCO2 arterial: 48 mmHg (ref 32.0–48.0)
pH, Arterial: 7.32 — ABNORMAL LOW (ref 7.350–7.450)
pO2, Arterial: 111 mmHg — ABNORMAL HIGH (ref 83.0–108.0)

## 2018-08-31 LAB — GLUCOSE, CAPILLARY: Glucose-Capillary: 192 mg/dL — ABNORMAL HIGH (ref 70–99)

## 2018-08-31 SURGERY — FIXATION, FRACTURE, INTERTROCHANTERIC, WITH INTRAMEDULLARY ROD
Anesthesia: General | Laterality: Right

## 2018-08-31 MED ORDER — ONDANSETRON HCL 4 MG/2ML IJ SOLN
INTRAMUSCULAR | Status: DC | PRN
  Administered 2018-08-31: 4 mg via INTRAVENOUS

## 2018-08-31 MED ORDER — CELECOXIB 200 MG PO CAPS
200.0000 mg | ORAL_CAPSULE | Freq: Two times a day (BID) | ORAL | Status: DC
  Filled 2018-08-31 (×2): qty 1

## 2018-08-31 MED ORDER — ETOMIDATE 2 MG/ML IV SOLN
INTRAVENOUS | Status: DC | PRN
  Administered 2018-08-31: 14 mg via INTRAVENOUS
  Administered 2018-08-31: 6 mg via INTRAVENOUS

## 2018-08-31 MED ORDER — FLEET ENEMA 7-19 GM/118ML RE ENEM: 1.0000 | ENEMA | Freq: Once | RECTAL | Status: DC | PRN

## 2018-08-31 MED ORDER — LIDOCAINE HCL (PF) 2 % IJ SOLN
INTRAMUSCULAR | Status: AC
  Filled 2018-08-31: qty 10

## 2018-08-31 MED ORDER — MORPHINE SULFATE (PF) 2 MG/ML IV SOLN
INTRAVENOUS | Status: AC
  Administered 2018-08-31: 2 mg via INTRAVENOUS
  Filled 2018-08-31: qty 1

## 2018-08-31 MED ORDER — MAGNESIUM HYDROXIDE 400 MG/5ML PO SUSP: 30.0000 mL | Freq: Every day | ORAL | Status: DC | PRN

## 2018-08-31 MED ORDER — PHENYLEPHRINE HCL (PRESSORS) 10 MG/ML IV SOLN
INTRAVENOUS | Status: DC | PRN
  Administered 2018-08-31: 100 ug via INTRAVENOUS
  Administered 2018-08-31: 200 ug via INTRAVENOUS
  Administered 2018-08-31: 100 ug via INTRAVENOUS

## 2018-08-31 MED ORDER — METOCLOPRAMIDE HCL 5 MG/ML IJ SOLN
5.0000 mg | Freq: Three times a day (TID) | INTRAMUSCULAR | Status: DC | PRN
  Filled 2018-08-31: qty 2

## 2018-08-31 MED ORDER — ONDANSETRON HCL 4 MG/2ML IJ SOLN: 4.0000 mg | Freq: Once | INTRAMUSCULAR | Status: DC | PRN

## 2018-08-31 MED ORDER — METOCLOPRAMIDE HCL 5 MG PO TABS
5.0000 mg | ORAL_TABLET | Freq: Three times a day (TID) | ORAL | Status: DC | PRN
  Filled 2018-08-31: qty 2

## 2018-08-31 MED ORDER — LIDOCAINE HCL (CARDIAC) PF 100 MG/5ML IV SOSY
PREFILLED_SYRINGE | INTRAVENOUS | Status: DC | PRN
  Administered 2018-08-31: 100 mg via INTRAVENOUS

## 2018-08-31 MED ORDER — SODIUM CHLORIDE 0.9 % IV SOLN: INTRAVENOUS | Status: DC

## 2018-08-31 MED ORDER — FERROUS SULFATE 325 (65 FE) MG PO TABS: 325.0000 mg | ORAL_TABLET | Freq: Two times a day (BID) | ORAL | Status: DC

## 2018-08-31 MED ORDER — PHENOL 1.4 % MT LIQD
1.0000 | OROMUCOSAL | Status: DC | PRN
  Filled 2018-08-31: qty 177

## 2018-08-31 MED ORDER — ONDANSETRON HCL 4 MG/2ML IJ SOLN
INTRAMUSCULAR | Status: AC
  Filled 2018-08-31: qty 2

## 2018-08-31 MED ORDER — CEFAZOLIN SODIUM-DEXTROSE 2-4 GM/100ML-% IV SOLN
2.0000 g | Freq: Four times a day (QID) | INTRAVENOUS | Status: DC
  Administered 2018-09-01: 2 g via INTRAVENOUS
  Filled 2018-08-31 (×4): qty 100

## 2018-08-31 MED ORDER — FENTANYL CITRATE (PF) 100 MCG/2ML IJ SOLN
INTRAMUSCULAR | Status: DC | PRN
  Administered 2018-08-31 (×2): 50 ug via INTRAVENOUS

## 2018-08-31 MED ORDER — ASPIRIN EC 325 MG PO TBEC: 325.0000 mg | DELAYED_RELEASE_TABLET | Freq: Every day | ORAL | Status: DC

## 2018-08-31 MED ORDER — GABAPENTIN 300 MG PO CAPS: 300.0000 mg | ORAL_CAPSULE | Freq: Every day | ORAL | Status: DC

## 2018-08-31 MED ORDER — CEFAZOLIN SODIUM 1 G IJ SOLR
INTRAMUSCULAR | Status: AC
  Filled 2018-08-31: qty 20

## 2018-08-31 MED ORDER — MENTHOL 3 MG MT LOZG
1.0000 | LOZENGE | OROMUCOSAL | Status: DC | PRN
  Filled 2018-08-31: qty 9

## 2018-08-31 MED ORDER — FENTANYL CITRATE (PF) 100 MCG/2ML IJ SOLN
INTRAMUSCULAR | Status: AC
  Filled 2018-08-31: qty 2

## 2018-08-31 MED ORDER — SUGAMMADEX SODIUM 200 MG/2ML IV SOLN
INTRAVENOUS | Status: DC | PRN
  Administered 2018-08-31: 177.8 mg via INTRAVENOUS

## 2018-08-31 MED ORDER — FENTANYL CITRATE (PF) 100 MCG/2ML IJ SOLN: 25.0000 ug | INTRAMUSCULAR | Status: DC | PRN

## 2018-08-31 MED ORDER — CEFAZOLIN SODIUM-DEXTROSE 2-3 GM-%(50ML) IV SOLR
INTRAVENOUS | Status: DC | PRN
  Administered 2018-08-31: 2 g via INTRAVENOUS

## 2018-08-31 MED ORDER — DEXAMETHASONE SODIUM PHOSPHATE 10 MG/ML IJ SOLN
INTRAMUSCULAR | Status: AC
  Filled 2018-08-31: qty 1

## 2018-08-31 MED ORDER — ROCURONIUM BROMIDE 100 MG/10ML IV SOLN
INTRAVENOUS | Status: DC | PRN
  Administered 2018-08-31 (×2): 30 mg via INTRAVENOUS

## 2018-08-31 MED ORDER — SUGAMMADEX SODIUM 200 MG/2ML IV SOLN
INTRAVENOUS | Status: AC
  Filled 2018-08-31: qty 2

## 2018-08-31 MED ORDER — SODIUM CHLORIDE 0.9 % IV SOLN
INTRAVENOUS | Status: DC | PRN
  Administered 2018-08-31: 25 ug/min via INTRAVENOUS

## 2018-08-31 MED ORDER — ACETAMINOPHEN 325 MG PO TABS: 325.0000 mg | ORAL_TABLET | Freq: Four times a day (QID) | ORAL | Status: DC | PRN

## 2018-08-31 MED ORDER — ETOMIDATE 2 MG/ML IV SOLN
INTRAVENOUS | Status: AC
  Filled 2018-08-31: qty 10

## 2018-08-31 MED ORDER — BISACODYL 10 MG RE SUPP: 10.0000 mg | Freq: Every day | RECTAL | Status: DC | PRN

## 2018-08-31 MED ORDER — DEXAMETHASONE SODIUM PHOSPHATE 10 MG/ML IJ SOLN
INTRAMUSCULAR | Status: DC | PRN
  Administered 2018-08-31: 4 mg via INTRAVENOUS

## 2018-08-31 MED ORDER — MORPHINE SULFATE (PF) 2 MG/ML IV SOLN
2.0000 mg | INTRAVENOUS | Status: DC | PRN
  Administered 2018-08-31: 4 mg via INTRAVENOUS
  Administered 2018-08-31 (×3): 2 mg via INTRAVENOUS
  Administered 2018-08-31 – 2018-09-01 (×5): 4 mg via INTRAVENOUS
  Filled 2018-08-31: qty 2
  Filled 2018-08-31: qty 1
  Filled 2018-08-31 (×3): qty 2
  Filled 2018-08-31: qty 1
  Filled 2018-08-31 (×2): qty 2

## 2018-08-31 MED ORDER — SODIUM CHLORIDE 0.9 % IV SOLN
INTRAVENOUS | Status: DC | PRN
  Administered 2018-08-31: 13:00:00 via INTRAVENOUS

## 2018-08-31 SURGICAL SUPPLY — 44 items
BLADE SURG 15 STRL LF DISP TIS (BLADE) ×1 IMPLANT
BLADE SURG 15 STRL SS (BLADE) ×2
CANISTER SUCT 1200ML W/VALVE (MISCELLANEOUS) ×3 IMPLANT
CHLORAPREP W/TINT 26 (MISCELLANEOUS) ×3 IMPLANT
COVER WAND RF STERILE (DRAPES) ×3 IMPLANT
DRAPE INCISE IOBAN 66X45 STRL (DRAPES) ×3 IMPLANT
DRAPE SHEET LG 3/4 BI-LAMINATE (DRAPES) ×3 IMPLANT
DRAPE SURG 17X11 SM STRL (DRAPES) IMPLANT
DRAPE U-SHAPE 47X51 STRL (DRAPES) IMPLANT
DRSG OPSITE POSTOP 3X4 (GAUZE/BANDAGES/DRESSINGS) IMPLANT
ELECT REM PT RETURN 9FT ADLT (ELECTROSURGICAL) ×3
ELECTRODE REM PT RTRN 9FT ADLT (ELECTROSURGICAL) ×1 IMPLANT
GAUZE SPONGE 4X4 12PLY STRL (GAUZE/BANDAGES/DRESSINGS) ×3 IMPLANT
GLOVE BIO SURGEON STRL SZ8.5 (GLOVE) ×15 IMPLANT
GLOVE BIOGEL PI IND STRL 8 (GLOVE) IMPLANT
GLOVE BIOGEL PI INDICATOR 8 (GLOVE)
GLOVE SURG SYN 7.5  E (GLOVE)
GLOVE SURG SYN 7.5 E (GLOVE) IMPLANT
GOWN STRL REUS W/ TWL LRG LVL3 (GOWN DISPOSABLE) ×2 IMPLANT
GOWN STRL REUS W/ TWL XL LVL3 (GOWN DISPOSABLE) IMPLANT
GOWN STRL REUS W/TWL LRG LVL3 (GOWN DISPOSABLE) ×4
GOWN STRL REUS W/TWL XL LVL3 (GOWN DISPOSABLE)
GUIDEROD T2 3X1000 (ROD) ×3 IMPLANT
K-WIRE  3.2X450M STR (WIRE) ×2
K-WIRE 3.2X450M STR (WIRE) ×1
KIT PATIENT CARE HANA TABLE (KITS) IMPLANT
KIT TURNOVER KIT A (KITS) ×3 IMPLANT
KWIRE 3.2X450M STR (WIRE) ×1 IMPLANT
MAT ABSORB  FLUID 56X50 GRAY (MISCELLANEOUS) ×4
MAT ABSORB FLUID 56X50 GRAY (MISCELLANEOUS) ×2 IMPLANT
NAIL LONG HIP 011X360MMX125D (Nail) ×3 IMPLANT
NEEDLE FILTER BLUNT 18X 1/2SAF (NEEDLE) ×2
NEEDLE FILTER BLUNT 18X1 1/2 (NEEDLE) ×1 IMPLANT
NEEDLE HYPO 22GX1.5 SAFETY (NEEDLE) ×3 IMPLANT
NS IRRIG 1000ML POUR BTL (IV SOLUTION) ×3 IMPLANT
PACK HIP COMPR (MISCELLANEOUS) ×3 IMPLANT
PENCIL ELECTRO HAND CTR (MISCELLANEOUS) IMPLANT
REAMER SHAFT BIXCUT (INSTRUMENTS) ×3 IMPLANT
SCREW LAG GAMMA 3 110MM (Screw) ×3 IMPLANT
STAPLER SKIN PROX 35W (STAPLE) ×3 IMPLANT
SUT VIC AB 2-0 CT2 27 (SUTURE) IMPLANT
SYR 10ML LL (SYRINGE) ×3 IMPLANT
SYR 30ML LL (SYRINGE) ×3 IMPLANT
TAPE CLOTH 3X10 WHT NS LF (GAUZE/BANDAGES/DRESSINGS) IMPLANT

## 2018-08-31 NOTE — Progress Notes (Signed)
DR. Rosey Bath at bedside, attempted to take patient to floor and patient doesn't want to wake with stimulation.  Holding chin lift jaw thrust.

## 2018-08-31 NOTE — Progress Notes (Signed)
Dr. Rosey Bath ok with patient proceeding to the floor.

## 2018-08-31 NOTE — Op Note (Signed)
OPERATIVE NOTE  DATE OF SURGERY:  08/18/2018  PATIENT NAME:  Vincent [name]   DOB: [birth year]  MRN: [id number]   PRE-OPERATIVE DIAGNOSIS: Right hip intertrochanteric fracture  POST-OPERATIVE DIAGNOSIS:  Same  PROCEDURE:  Procedure(s): INTRAMEDULLARY (IM) NAIL INTERTROCHANTRIC fracture right hip  SURGEON:  Enzo Bi, M.D.   ASSISTANT:   ANESTHESIA: General endotracheal anesthesia  ESTIMATED BLOOD LOSS: 50 mL  FLUIDS REPLACED: 1000 mL of crystalloid  TOURNIQUET TIME: 0 minutes  DRAINS: None  IMPLANTS UTILIZED: Stryker gamma 3 nail 360 mm long and 11 mm in diameter with 1 125 degrees proximal lag screw measuring 110 mm  Brief history: Patient is a 85 years old male who was at an assisted living facility and had a fall.  Patient had significant comorbidities including history of dementia and coronary artery disease with a defibrillator in place.  Patient was brought to the ER at [facility].  X-ray showed evidence of right hip intertrochanteric displaced fracture.  We had a detailed discussion with the power of attorney who was his daughter.  Patient was initially DNR but family decided to proceed with surgical fixation of the fracture.  Family understood that patient is a high surgical risk.  Family understood that the risks involved in surgery include but are not limited to risk of infection, damage to the nerve, blood vessel, need for further surgery, implant related complications, continued pain, DVT, pulmonary embolism, stroke and even death.  Family volunteered an informed consent.  Preop clearance was obtained by the hospitalist service as well as cardiologist.  Patient was seen on the day of surgery in the preop holding area.  Surgical was marked.  Patient was then wheeled back into the operating room.  Description of the procedure: Patient was intubated while on the hospital bed.  2 g of IV Ancef was given and proper timeout was performed.  Patient  was then transferred to the fracture table.  Left lower extremity was placed in well leg holder for right lower extremity was placed in traction.  Preop reduction of the fracture was obtained with longitudinal traction and internal rotation.  Right lower extremity was then prepped from the hip all the way down to the knee.  Started with a longitudinal incision just proximal to the greater trochanter.  Entrance was created using a curved bone awl.  We then used a ball-tipped probe that was inserted across the fracture site into the distal fragment.  The nail length was measured as 360 mm long.  We then started with progressive reaming and the canal was reamed up to size 13 mm reamer.  We then procured a 11 mm diameter nail measuring 360 mm along with 125 degrees proximal lag screw.  Using a guidewire we then drilled for the proximal lag screw.  The position of the lag screw was checked under AP and lateral C-arm images to maintain optimal tip to apex distance.  The lag screw was made 110 mm long.  We then proceeded with the reaming for the lag screw and inserted a 110 mm lag screw at 125 degrees.  Distal locking screw was used.  Final AP and lateral x-rays were obtained.  Lateral knee x-rays were obtained.  The wound was then copiously irrigated with normal saline.  It was closed with #0 Vicryl.  Skin was closed with staples.  Patient tolerated the procedure very well and was taken to the recovery room in stable condition.   Disposition: Patient will be weightbearing as  tolerated on his right lower extremity.  He will follow-up in the clinic in 2 weeks.  Kala Gassmann F Breckyn Ticas M.D.

## 2018-08-31 NOTE — Anesthesia Post-op Follow-up Note (Signed)
Anesthesia QCDR form completed.        

## 2018-08-31 NOTE — Progress Notes (Signed)
Bag of belongings brought, no hearing aids, no glasses.  Chaplain went to check his previous room and got his watch and glasses.  NO HEARING AIDS FOUND.  Phillis Knack, RN (ICU)

## 2018-08-31 NOTE — Progress Notes (Signed)
15 minute call to floor. 

## 2018-08-31 NOTE — Anesthesia Procedure Notes (Signed)
Procedure Name: Intubation Performed by: Kara Mierzejewski, CRNA Pre-anesthesia Checklist: Patient identified, Patient being monitored, Timeout performed, Emergency Drugs available and Suction available Patient Re-evaluated:Patient Re-evaluated prior to induction Oxygen Delivery Method: Circle system utilized Preoxygenation: Pre-oxygenation with 100% oxygen Induction Type: IV induction Ventilation: Mask ventilation without difficulty Laryngoscope Size: 3 and McGraph Grade View: Grade I Tube type: Oral Tube size: 7.0 mm Number of attempts: 1 Airway Equipment and Method: Stylet and Video-laryngoscopy Placement Confirmation: ETT inserted through vocal cords under direct vision,  positive ETCO2 and breath sounds checked- equal and bilateral Secured at: 22 cm Tube secured with: Tape Dental Injury: Teeth and Oropharynx as per pre-operative assessment        

## 2018-08-31 NOTE — Progress Notes (Signed)
[employer] [facility] [place] [facility]                                                                                                                                                                                  Patient Demographics   Vincent [name], is a 85 y.o. male, DOB - 14-Nov-1919, ZOX:[id number]  Admit date - Feb 18, 2019   Admitting Physician [name], MD  Outpatient Primary MD for the patient is System, Pcp Not In   LOS - 2  Subjective: Patient states that he wants to look at his hip    Review of Systems:   CONSTITUTIONAL: Difficulty to understand patient due to hard of hearing  Vitals:   Vitals:   08/30/18 0750 08/30/18 1524 08/30/18 2324 09/16/2018 0730  BP: 120/65 (!) 99/58 (!) 111/57 120/87  Pulse: 77 76 78 89  Resp: 20 17 19 16   Temp: 97.7 F (36.5 C) 98 F (36.7 C) 98.8 F (37.1 C) 98.5 F (36.9 C)  TempSrc: Oral  Oral Oral  SpO2: 100% 100% 97% 96%  Weight:      Height:        Wt Readings from Last 3 Encounters:  08/30/18 88.9 kg  05/07/18 94.5 kg  03/20/16 94.5 kg     Intake/Output Summary (Last 24 hours) at 09/03/2018 1201 Last data filed at 08/30/2018 [id number] Gross per 24 hour  Intake 120 ml  Output 450 ml  Net -330 ml    Physical Exam:   GENERAL: Pleasant-appearing in no apparent distress.  HEAD, EYES, EARS, NOSE AND THROAT: Atraumatic, normocephalic. Extraocular muscles are intact. Pupils equal and reactive to light. Sclerae anicteric. No conjunctival injection. No oro-pharyngeal erythema.  NECK: Supple. There is no jugular venous distention. No bruits, no lymphadenopathy, no thyromegaly.  HEART: Regular rate and rhythm,. No murmurs, no rubs, no clicks.  LUNGS: Clear to auscultation bilaterally. No rales or rhonchi. No wheezes.  ABDOMEN: Soft, flat, nontender, nondistended. Has good bowel sounds. No hepatosplenomegaly appreciated.  EXTREMITIES: No evidence of any cyanosis, clubbing, or peripheral edema.  +2 pedal and radial  pulses bilaterally.  NEUROLOGIC: The patient is alert, no focal deficits noted confused SKIN: Moist and warm with no rashes appreciated.  Psych: Not anxious, depressed LN: No inguinal LN enlargement    Antibiotics   Anti-infectives (From admission, onward)   None      Medications   Scheduled Meds: . bumetanide  0.5 mg Oral Daily  . Chlorhexidine Gluconate Cloth  6 each Topical Q0600  . mupirocin ointment  1 application Nasal BID  . simvastatin  40 mg Oral QPM   Continuous Infusions: PRN Meds:.acetaminophen **OR** acetaminophen, ALPRAZolam, divalproex, morphine injection,  ondansetron **OR** ondansetron (ZOFRAN) IV, oxyCODONE   Data Review:   Micro Results Recent Results (from the past 240 hour(s))  SARS Coronavirus 2 (CEPHEID - Performed in [facility] hospital lab), Hosp Order     Status: None   Collection Time: 09/03/2018  9:40 PM   Specimen: Nasopharyngeal Swab  Result Value Ref Range Status   SARS Coronavirus 2 NEGATIVE NEGATIVE Final    Comment: (NOTE) If result is NEGATIVE SARS-CoV-2 target nucleic acids are NOT DETECTED. The SARS-CoV-2 RNA is generally detectable in upper and lower  respiratory specimens during the acute phase of infection. The lowest  concentration of SARS-CoV-2 viral copies this assay can detect is 250  copies / mL. A negative result does not preclude SARS-CoV-2 infection  and should not be used as the sole basis for treatment or other  patient management decisions.  A negative result may occur with  improper specimen collection / handling, submission of specimen other  than nasopharyngeal swab, presence of viral mutation(s) within the  areas targeted by this assay, and inadequate number of viral copies  (<250 copies / mL). A negative result must be combined with clinical  observations, patient history, and epidemiological information. If result is POSITIVE SARS-CoV-2 target nucleic acids are DETECTED. The SARS-CoV-2 RNA is generally  detectable in upper and lower  respiratory specimens dur ing the acute phase of infection.  Positive  results are indicative of active infection with SARS-CoV-2.  Clinical  correlation with patient history and other diagnostic information is  necessary to determine patient infection status.  Positive results do  not rule out bacterial infection or co-infection with other viruses. If result is PRESUMPTIVE POSTIVE SARS-CoV-2 nucleic acids MAY BE PRESENT.   A presumptive positive result was obtained on the submitted specimen  and confirmed on repeat testing.  While 2019 novel coronavirus  (SARS-CoV-2) nucleic acids may be present in the submitted sample  additional confirmatory testing may be necessary for epidemiological  and / or clinical management purposes  to differentiate between  SARS-CoV-2 and other Sarbecovirus currently known to infect humans.  If clinically indicated additional testing with an alternate test  methodology [phone]([id number]) is advised. The SARS-CoV-2 RNA is generally  detectable in upper and lower respiratory sp ecimens during the acute  phase of infection. The expected result is Negative. Fact Sheet for Patients:  BoilerBrush.com.cyhttps://www.fda.gov/media/136312/download Fact Sheet for Healthcare Providers: https://pope.com/https://www.fda.gov/media/136313/download This test is not yet approved or cleared by the [place] FDA and has been authorized for detection and/or diagnosis of SARS-CoV-2 by FDA under an Emergency Use Authorization (EUA).  This EUA will remain in effect (meaning this test can be used) for the duration of the COVID-19 declaration under Section 564(b)(1) of the Act, 21 U.S.C. section 360bbb-3(b)(1), unless the authorization is terminated or revoked sooner. Performed at [facility], [address] [place], [place] [address]   MRSA PCR Screening     Status: Abnormal   Collection Time: 08/30/18 12:16 AM   Specimen: Nasal Mucosa; Nasopharyngeal  Result Value Ref  Range Status   MRSA by PCR POSITIVE (A) NEGATIVE Final    Comment:        The GeneXpert MRSA Assay (FDA approved for NASAL specimens only), is one component of a comprehensive MRSA colonization surveillance program. It is not intended to diagnose MRSA infection nor to guide or monitor treatment for MRSA infections. CRITICAL RESULT CALLED TO, READ BACK BY AND VERIFIED WITH: OLIVE KIBERENGE @150  08/30/2018 TTG  Performed at [facility],  [place], [place] [address]     Radiology Reports Dg Chest 1 View  Result Date: 08/20/2018 CLINICAL DATA:  Pt arrives via ems from [facility]. Ems reports pt fell From unknown cause without hitting head or any LOC. Hx of dementia at baseline. Outward rotation of the right hip with shortening noted on arrival. EXAM: CHEST  1 VIEW COMPARISON:  07/21/2017 FINDINGS: Stable changes from prior CABG surgery. Cardiac silhouette is mildly enlarged. Right anterior chest wall biventricular cardioverter-defibrillator is stable. No mediastinal or hilar masses. Bilateral apically pleural thickening with associated a pickle parenchymal scarring, unchanged. Lungs otherwise clear. No pleural effusion and no pneumothorax. Skeletal structures are demineralized. No acute skeletal abnormality. IMPRESSION: No acute cardiopulmonary disease. Electronically Signed   By: Lajean Manes M.D.   On: 08/25/2018 19:52   Dg Hip Unilat W Or Wo Pelvis 2-3 Views Right  Result Date: 09/03/2018 CLINICAL DATA:  Fall with right hip pain and external rotation of the right leg. EXAM: DG HIP (WITH OR WITHOUT PELVIS) 2-3V RIGHT COMPARISON:  None. FINDINGS: There is a comminuted, displaced intertrochanteric fracture of the proximal right femur. The primary proximal fracture component is displaced from the distal fracture component by 1.2 cm superiorly and laterally. There is also mild varus angulation. There separate fractures of the greater and lesser  trochanters. There are no other fractures.  No bone lesions. Hip joints, SI joints and symphysis pubis are normally aligned. Skeletal structures are diffusely demineralized. IMPRESSION: Comminuted, mildly displaced intertrochanteric fracture of the proximal right femur. No dislocation. Electronically Signed   By: Lajean Manes M.D.   On: 09/14/2018 19:51     CBC Recent Labs  Lab 08/22/2018 1959 08/30/18 0509  WBC 10.0 12.8*  HGB 13.3 13.0  HCT 40.5 40.0  PLT 114* 117*  MCV 95.7 97.8  MCH 31.4 31.8  MCHC 32.8 32.5  RDW 12.4 12.4  LYMPHSABS 1.9  --   MONOABS 0.8  --   EOSABS 0.1  --   BASOSABS 0.0  --     Chemistries  Recent Labs  Lab 09/05/2018 1959 08/30/18 0509  NA 140 141  K 3.8 4.0  CL 105 105  CO2 24 24  GLUCOSE 160* 142*  BUN 27* 25*  CREATININE 1.33* 1.33*  CALCIUM 8.6* 8.7*  AST 18  --   ALT 13  --   ALKPHOS 133*  --   BILITOT 0.7  --    ------------------------------------------------------------------------------------------------------------------ estimated creatinine clearance is 34.8 mL/min (A) (by C-G formula based on SCr of 1.33 mg/dL (H)). ------------------------------------------------------------------------------------------------------------------ No results for input(s): HGBA1C in the last 72 hours. ------------------------------------------------------------------------------------------------------------------ No results for input(s): CHOL, HDL, LDLCALC, TRIG, CHOLHDL, LDLDIRECT in the last 72 hours. ------------------------------------------------------------------------------------------------------------------ No results for input(s): TSH, T4TOTAL, T3FREE, THYROIDAB in the last 72 hours.  Invalid input(s): FREET3 ------------------------------------------------------------------------------------------------------------------ No results for input(s): VITAMINB12, FOLATE, FERRITIN, TIBC, IRON, RETICCTPCT in the last 72 hours.  Coagulation  profile Recent Labs  Lab 08/19/2018 1959  INR 1.2    No results for input(s): DDIMER in the last 72 hours.  Cardiac Enzymes No results for input(s): CKMB, TROPONINI, MYOGLOBIN in the last 168 hours.  Invalid input(s): CK ------------------------------------------------------------------------------------------------------------------ Invalid input(s): [place]  Patient is 85 year old status post fall  Closed right hip fracture ([place]) -plan per surgery per orthopedics today Cleared by cardiology Moderate to high risk based on advanced age and chronic medical conditions  Chronic systolic CHF (congestive heart failure) ([place]) -continue  home meds     Atrial fibrillation (HCC) -continue home medications.    Continue to monitor   CAD (coronary artery disease) -continue home meds   CKD (chronic kidney disease), stage III (HCC) -at baseline, avoid nephrotoxins and monitor       Code Status Orders  (From admission, onward)         Start     Ordered   09/11/2018 2348  Do not attempt resuscitation (DNR)  Continuous    Question Answer Comment  In the event of cardiac or respiratory ARREST Do not call a "code blue"   In the event of cardiac or respiratory ARREST Do not perform Intubation, CPR, defibrillation or ACLS   In the event of cardiac or respiratory ARREST Use medication by any route, position, wound care, and other measures to relive pain and suffering. May use oxygen, suction and manual treatment of airway obstruction as needed for comfort.      08/18/2018 2347        Code Status History    Date Active Date Inactive Code Status Order ID Comments User Context   03/15/2016 2340 03/20/2016 2027 Full Code [id number]  Oralia ManisWillis, David, MD Inpatient   12/10/2015 2005 12/13/2015 1104 Full Code [id number]  Houston SirenSainani, Vivek J, MD Inpatient   06/03/2015 0438 06/03/2015 1503 Full Code [id number]  Enedina FinnerPatel, Sona, MD Inpatient   Advance Care Planning Activity    Advance  Directive Documentation     Most Recent Value  Type of Advance Directive  Out of facility DNR (pink MOST or yellow form)  Pre-existing out of facility DNR order (yellow form or pink MOST form)  Yellow form placed in chart (order not valid for inpatient use), Physician notified to receive inpatient order  "MOST" Form in Place?  -           Consults cardiology and orthopedics   DVT Prophylaxis SCDs until surgery is over Lab Results  Component Value Date   PLT 117 (L) 08/30/2018     Time Spent in minutes 35 minutes greater than 50% of time spent in care coordination and counseling patient regarding the condition and plan of care.   Auburn BilberryShreyang Melanye Hiraldo M.D on 09/13/2018 at 12:01 PM  Between 7am to 6pm - Pager - [phone]  After 6pm go to [email or web address] - Social research officer, governmentpassword [email or web address]  [employer]   Office  [phone]

## 2018-08-31 NOTE — Progress Notes (Signed)
RT assisted with patient transfer from PACU to ICU room 18 with no complications. Once settled in room patient placed in AVAPS mode on the bipap with settings as follows: Vt-500, Epap-5, 30%, and a max pressure of 22. Patient tol well at this time. Dr. Mortimer Fries notified of changes made.

## 2018-08-31 NOTE — Progress Notes (Signed)
IV in left ac is now swollen and puffy.  Dr. Rosey Bath aware, patient is a difficult stick with multiple attempts previously by Anesthesia in the OR.

## 2018-08-31 NOTE — Progress Notes (Signed)
Patient is now on bi-pap.  Dr Rosey Bath remains at bedside.  Patient offers no response to voice or touch.  Notified ICU for bed.  Awaiting back to hear.

## 2018-08-31 NOTE — Progress Notes (Signed)
CRITICAL CARE NOTE  CC  Acute resp failure  SUBJECTIVE Patient with multiple medial issues Admitted to ICU for post op resp failure Pco2 65, pH 7.2 Placed on biPAP Patient with s/p HIP surgery patient underwent surgery lasted 1.5 hrs Patient with severe hypercapnic resp failure    BP (!) 133/105 (BP Location: Right Arm)   Pulse 91   Temp 97.8 F (36.6 C) (Axillary)   Resp (!) 21   Ht 6' (1.829 m)   Wt 92.1 kg   SpO2 96%   BMI 27.54 kg/m    I/O last 3 completed shifts: In: 120 [P.O.:120] Out: 850 [Urine:850] Total I/O In: 500 [I.V.:500] Out: 100 [Urine:50; Blood:50]  SpO2: 96 % O2 Flow Rate (L/min): 15 L/min FiO2 (%): (!) 6 %   SIGNIFICANT EVENTS 6/14 s/p RT HIP surgery, hypercapnic resp failure on biPAP  REVIEW OF SYSTEMS  PATIENT IS UNABLE TO PROVIDE COMPLETE REVIEW OF SYSTEMS DUE TO SEVERE CRITICAL ILLNESS   PHYSICAL EXAMINATION:  GENERAL:critically ill appearing, +resp distress HEAD: Normocephalic, atraumatic.  EYES: Pupils equal, round, reactive to light.  No scleral icterus. Pinpoint pupils MOUTH: Moist mucosal membrane. NECK: Supple. No thyromegaly. No nodules. No JVD.  PULMONARY: +rhonchi, +wheezing CARDIOVASCULAR: S1 and S2. Regular rate and rhythm. No murmurs, rubs, or gallops.  GASTROINTESTINAL: Soft, nontender, -distended. No masses. Positive bowel sounds. No hepatosplenomegaly.  MUSCULOSKELETAL: No swelling, clubbing, or edema.  NEUROLOGIC: obtunded, GCS<8 SKIN:intact,warm,dry  MEDICATIONS: I have reviewed all medications and confirmed regimen as documented   CULTURE RESULTS   Recent Results (from the past 240 hour(s))  SARS Coronavirus 2 (CEPHEID - Performed in [facility] lab), Hosp Order     Status: None   Collection Time: 08/31/2018  9:40 PM   Specimen: Nasopharyngeal Swab  Result Value Ref Range Status   SARS Coronavirus 2 NEGATIVE NEGATIVE Final    Comment: (NOTE) If result is NEGATIVE SARS-CoV-2 target nucleic acids  are NOT DETECTED. The SARS-CoV-2 RNA is generally detectable in upper and lower  respiratory specimens during the acute phase of infection. The lowest  concentration of SARS-CoV-2 viral copies this assay can detect is 250  copies / mL. A negative result does not preclude SARS-CoV-2 infection  and should not be used as the sole basis for treatment or other  patient management decisions.  A negative result may occur with  improper specimen collection / handling, submission of specimen other  than nasopharyngeal swab, presence of viral mutation(s) within the  areas targeted by this assay, and inadequate number of viral copies  (<250 copies / mL). A negative result must be combined with clinical  observations, patient history, and epidemiological information. If result is POSITIVE SARS-CoV-2 target nucleic acids are DETECTED. The SARS-CoV-2 RNA is generally detectable in upper and lower  respiratory specimens dur ing the acute phase of infection.  Positive  results are indicative of active infection with SARS-CoV-2.  Clinical  correlation with patient history and other diagnostic information is  necessary to determine patient infection status.  Positive results do  not rule out bacterial infection or co-infection with other viruses. If result is PRESUMPTIVE POSTIVE SARS-CoV-2 nucleic acids MAY BE PRESENT.   A presumptive positive result was obtained on the submitted specimen  and confirmed on repeat testing.  While 2019 novel coronavirus  (SARS-CoV-2) nucleic acids may be present in the submitted sample  additional confirmatory testing may be necessary for epidemiological  and / or clinical management purposes  to differentiate between  SARS-CoV-2 and  other Sarbecovirus currently known to infect humans.  If clinically indicated additional testing with an alternate test  methodology [phone]) is advised. The SARS-CoV-2 RNA is generally  detectable in upper and lower respiratory sp ecimens  during the acute  phase of infection. The expected result is Negative. Fact Sheet for Patients:  BoilerBrush.com.cyhttps://www.fda.gov/media/136312/download Fact Sheet for Healthcare Providers: https://pope.com/https://www.fda.gov/media/136313/download This test is not yet approved or cleared by the [place] FDA and has been authorized for detection and/or diagnosis of SARS-CoV-2 by FDA under an Emergency Use Authorization (EUA).  This EUA will remain in effect (meaning this test can be used) for the duration of the COVID-19 declaration under Section 564(b)(1) of the Act, 21 U.S.C. section 360bbb-3(b)(1), unless the authorization is terminated or revoked sooner. Performed at [facility], [address] [place], [place] [address]   MRSA PCR Screening     Status: Abnormal   Collection Time: 08/30/18 12:16 AM   Specimen: Nasal Mucosa; Nasopharyngeal  Result Value Ref Range Status   MRSA by PCR POSITIVE (A) NEGATIVE Final    Comment:        The GeneXpert MRSA Assay (FDA approved for NASAL specimens only), is one component of a comprehensive MRSA colonization surveillance program. It is not intended to diagnose MRSA infection nor to guide or monitor treatment for MRSA infections. CRITICAL RESULT CALLED TO, READ BACK BY AND VERIFIED WITH: OLIVE KIBERENGE @150  08/30/2018 TTG  Performed at [facility], [address] [place], [place] [address]           IMAGING    Dg Hip Operative Unilat W Or W/o Pelvis Right  Result Date: 09/16/2018 CLINICAL DATA:  ORIF of a right proximal femur fracture. EXAM: OPERATIVE RIGHT HIP (WITH PELVIS IF PERFORMED) 6 VIEWS TECHNIQUE: Fluoroscopic spot image(s) were submitted for interpretation post-operatively. COMPARISON:  08/18/2018 FINDINGS: Images show placement of an intramedullary rod extending into the distal femur, supporting a compression screw that crosses the femoral neck into the femoral head. This reduces the primary fracture components into near  anatomic alignment, femoral neck component remains mildly inferiorly displaced. Orthopedic hardware appears well-seated. No evidence of an operative complication. IMPRESSION: Well aligned proximal right femur fracture following ORIF. Electronically Signed   By: Amie Portlandavid  Ormond M.D.   On: 09/11/2018 16:38    Active Ambulatory Problems    Diagnosis Date Noted  . Cellulitis 12/10/2015  . Sepsis (HCC) 03/15/2016  . CAP (community acquired pneumonia) 03/15/2016  . Chronic systolic CHF (congestive heart failure) (HCC) 03/15/2016  . Atrial fibrillation (HCC) 03/15/2016  . GERD (gastroesophageal reflux disease) 03/15/2016  . CAD (coronary artery disease) 03/15/2016  . UTI (urinary tract infection) 03/16/2016   Resolved Ambulatory Problems    Diagnosis Date Noted  . Chest pain 06/03/2015   Past Medical History:  Diagnosis Date  . AICD (automatic cardioverter/defibrillator) present   . CHF (congestive heart failure) (HCC)   . CKD (chronic kidney disease)   . Coronary artery disease   . Presence of permanent cardiac pacemaker        ASSESSMENT AND PLAN SYNOPSIS  Severe ACUTE Hypoxic and Hypercapnic Respiratory Failure from sedation in setting of significant cardiac disease  -continue biPAP as tolerated -repeat ABG Avoid sedatives at this time   ACUTE SYSTOLIC CARDIAC FAILURE- EF 45% -oxygen as needed -Lasix as tolerated   ACUTE KIDNEY INJURY/Renal Failure -follow chem 7 -follow UO -continue Foley Catheter-assess need -Avoid nephrotoxic agents Follow creatinine   NEUROLOGY Obtunded and encephalopathic    CARDIAC  ICU monitoring  ID -continue IV abx as prescibed -follow up cultures  GI GI PROPHYLAXIS as indicated  NUTRITIONAL STATUS DIET-->NPO Constipation protocol as indicated  ENDO - will use ICU hypoglycemic\Hyperglycemia protocol if indicated   ELECTROLYTES -follow labs as needed -replace as needed -pharmacy consultation and following   DVT/GI PRX  ordered TRANSFUSIONS AS NEEDED MONITOR FSBS ASSESS the need for LABS as needed   Critical Care Time devoted to patient care services described in this note is 35 minutes.   Overall, patient is critically ill, prognosis is guarded.  Patient with Multiorgan failure and at high risk for cardiac arrest and death.    Corrin Parker, M.D.  [facility]  Medical Director [place] Director [facility] Cardio-Pulmonary Department

## 2018-08-31 NOTE — Progress Notes (Signed)
Family At bedside, clinical status relayed to family  Updated and notified of patients medical condition-  Progressive multiorgan failure with very low chance of meaningful recovery.  Family understands the situation.  They have consented and agreed to DNR/DNI and would like to proceed with Comfort care measures if biPAP fails  Will repeat ABG and assess gas exchange.  Patient is suffering in pain and will give morphine as needed I have explained to family that treating the pain  may compromise his resp status even more. They would like to proceed with Comfort care measures if medical therapy and BiPAP fails   Will update family accordingly   Family are satisfied with Plan of action and management. All questions answered  Corrin Parker, M.D.  [facility]  Medical Director [place] Director [facility] Cardio-Pulmonary Department

## 2018-08-31 NOTE — Progress Notes (Signed)
Due to complications after surgery, spiritual care requested. Staff report Ta[place] was on hospice prior to fall. Non-verbal. RN reports call was made to family no answer. Chaplain available upon request.     08/30/18 0019  Clinical Encounter Type  Visited With Patient;Health care provider  Visit Type Initial;Critical Care  Referral From [place]  Does Patient Have a Mental Health Advance Directive? Unable to assess, patient is non-responsive or altered mental status

## 2018-08-31 NOTE — Transfer of Care (Signed)
Immediate Anesthesia Transfer of Care Note  Patient: Vincent [name]  Procedure(s) Performed: INTRAMEDULLARY (IM) NAIL INTERTROCHANTRIC (Right )  Patient Location: PACU  Anesthesia Type:General  Level of Consciousness: drowsy  Airway & Oxygen Therapy: Patient Spontanous Breathing and Patient connected to face mask oxygen  Post-op Assessment: Report given to RN and Post -op Vital signs reviewed and stable  Post vital signs: Reviewed and stable  Last Vitals:  Vitals Value Taken Time  BP 145/61 09/15/2018 1430  Temp 36.5 C 08/23/2018 1430  Pulse 90 08/20/2018 1438  Resp 19 08/20/2018 1438  SpO2 100 % 08/27/2018 1438  Vitals shown include unvalidated device data.  Last Pain:  Vitals:   09/10/2018 1430  TempSrc:   PainSc: Asleep      Patients Stated Pain Goal: 2 (42/68/34 1962)  Complications: No apparent anesthesia complications

## 2018-08-31 NOTE — Progress Notes (Signed)
Dr. Rosey Bath remains at bedside, continues to use bag valve mask on patient, patient remains on cardiac monitor, no response to any stimulation.  Called for bi-pap and will notify ICU.

## 2018-09-01 ENCOUNTER — Encounter: Payer: Self-pay | Admitting: Orthopedic Surgery

## 2018-09-01 LAB — BASIC METABOLIC PANEL
Anion gap: 13 (ref 5–15)
BUN: 51 mg/dL — ABNORMAL HIGH (ref 8–23)
CO2: 25 mmol/L (ref 22–32)
Calcium: 8.5 mg/dL — ABNORMAL LOW (ref 8.9–10.3)
Chloride: 107 mmol/L (ref 98–111)
Creatinine, Ser: 2.94 mg/dL — ABNORMAL HIGH (ref 0.61–1.24)
GFR calc Af Amer: 20 mL/min — ABNORMAL LOW (ref >60)
GFR calc non Af Amer: 17 mL/min — ABNORMAL LOW (ref >60)
Glucose, Bld: 171 mg/dL — ABNORMAL HIGH (ref 70–99)
Potassium: 5.3 mmol/L — ABNORMAL HIGH (ref 3.5–5.1)
Sodium: 145 mmol/L (ref 135–145)

## 2018-09-01 LAB — BLOOD GAS, ARTERIAL
Acid-base deficit: 3.2 mmol/L — ABNORMAL HIGH (ref 0.0–2.0)
Bicarbonate: 26 mmol/L (ref 20.0–28.0)
O2 Saturation: 88.3 %
Patient temperature: 37
pCO2 arterial: 65 mmHg — ABNORMAL HIGH (ref 32.0–48.0)
pH, Arterial: 7.21 — ABNORMAL LOW (ref 7.350–7.450)
pO2, Arterial: 67 mmHg — ABNORMAL LOW (ref 83.0–108.0)

## 2018-09-01 LAB — CBC
HCT: 33.7 % — ABNORMAL LOW (ref 39.0–52.0)
Hemoglobin: 10.4 g/dL — ABNORMAL LOW (ref 13.0–17.0)
MCH: 31.6 pg (ref 26.0–34.0)
MCHC: 30.9 g/dL (ref 30.0–36.0)
MCV: 102.4 fL — ABNORMAL HIGH (ref 80.0–100.0)
Platelets: 129 10*3/uL — ABNORMAL LOW (ref 150–400)
RBC: 3.29 MIL/uL — ABNORMAL LOW (ref 4.22–5.81)
RDW: 13 % (ref 11.5–15.5)
WBC: 16.8 10*3/uL — ABNORMAL HIGH (ref 4.0–10.5)
nRBC: 0.4 % — ABNORMAL HIGH (ref 0.0–0.2)

## 2018-09-01 MED ORDER — MORPHINE 100MG IN NS 100ML (1MG/ML) PREMIX INFUSION
1.0000 mg/h | INTRAVENOUS | Status: DC
  Administered 2018-09-01: 5 mg/h via INTRAVENOUS
  Filled 2018-09-01: qty 100

## 2018-09-01 MED ORDER — DEXTROSE-NACL 5-0.45 % IV SOLN
INTRAVENOUS | Status: DC
  Administered 2018-09-01: 07:00:00 via INTRAVENOUS

## 2018-09-01 MED ORDER — GLYCOPYRROLATE 0.2 MG/ML IJ SOLN
0.2000 mg | Freq: Four times a day (QID) | INTRAMUSCULAR | Status: DC | PRN
  Filled 2018-09-01: qty 1

## 2018-09-17 NOTE — Death Summary Note (Addendum)
[place] at [facility] Date of Admission: 2018-09-08  6:50 PM  Date of death: 09-11-18   Admitting diagnosis: Fall with closed right hip fracture    Diagnosis at time of death* 1.  Acute respiratory failure post hip replacement 2.  Closed right hip fracture 3.  Chronic systolic CHF 4.  Atrial fibrillation 5.  Coronary artery disease    Hospital course Patient is 85 year old with history of chronic systolic CHF, atrial fibrillation, coronary artery disease, chronic kidney disease stage III who was admitted after a fall and noted to have hip fracture.  Patient's daughter was consented for him to have a hip fracture repair.  Post repair patient developed acute respiratory failure and required stay in the ICU.  Patient temporarily required BiPAP.  Due to his advanced age discussions were held with the family and he was made comfort care at.  Patient passed away.          TOTAL TIME TAKING CARE OF THIS PATIENT60minutes.    Dustin Flock M.D on September 11, 2018 at 11:50 AM  Between 7am to 6pm - Pager - [phone]  After 6pm go to [email or web address] - password [email or web address]  [facility] Office  [phone]  CC: Primary care physician; System, Pcp Not In

## 2018-09-17 NOTE — Progress Notes (Signed)
OT Cancellation Note  Patient Details Name: Vincent [name] MRN: [id number] DOB: 06-12-1919   Cancelled Treatment:    Reason Eval/Treat Not Completed: Patient not medically ready;Patient's level of consciousness. Thank you for the OT consult. Order received and chart reviewed. This OT spoke with RN regarding pt level of consciousness and ability to participate in OT eval this AM. Per RN, this pt continues to have no response to verbal or tactile cues and would not be able to participate in OT eval at this time. RN expects pt to transition to comfort care. Will continue to follow acutely.  Shara Blazing, M.S., OTR/L Ascom: [phone] 08/18/2018, 8:33 AM

## 2018-09-17 NOTE — Anesthesia Postprocedure Evaluation (Signed)
Anesthesia Post Note  Patient: [name]  Procedure(s) Performed: INTRAMEDULLARY (IM) NAIL INTERTROCHANTRIC (Right )  Patient location during evaluation: ICU Anesthesia Type: General Level of consciousness: awake Pain management: pain level controlled Vital Signs Assessment: post-procedure vital signs reviewed and stable Respiratory status: spontaneous breathing, nonlabored ventilation and respiratory function stable Cardiovascular status: stable Postop Assessment: no apparent nausea or vomiting Anesthetic complications: no     Last Vitals:  Vitals:   08/25/2018 1800 08/25/2018 0400  BP: (!) 88/69 (!) 79/52  Pulse: 86 75  Resp: 17 13  Temp:    SpO2: (!) 88% (!) 79%    Last Pain:  Vitals:   09/07/2018 1618  TempSrc: Axillary  PainSc:                  Ricki Miller

## 2018-09-17 NOTE — Progress Notes (Signed)
[facility] - [place] [facility]                                                                                                                                                                                  Patient Demographics   Vincent [name], is a 85 y.o. male, DOB - Jun 17, 1919, JXB:[id number]  Admit date - 08/23/2018   Admitting Physician [name], MD  Outpatient Primary MD for the patient is System, Pcp Not In   LOS - 3  Subjective: Patient had surgery but has developed postop respiratory failure post hip replacement    Review of Systems:   CONSTITUTIONAL: Unable to provide  Vitals:   Vitals:   09/08/2018 1800 2018/07/09 0400 2018/07/09 0800 2018/07/09 0900  BP: (!) 88/69 (!) 79/52 (!) 83/44   Pulse: 86 75 75 73  Resp: 17 13 15 15   Temp:      TempSrc:      SpO2: (!) 88% (!) 79% (!) 49% (!) 57%  Weight:      Height:        Wt Readings from Last 3 Encounters:  09/16/2018 92.1 kg  05/07/18 94.5 kg  03/20/16 94.5 kg     Intake/Output Summary (Last 24 hours) at 01-04-19 1045 Last data filed at 01-04-19 0600 Gross per 24 hour  Intake 500 ml  Output 100 ml  Net 400 ml    Physical Exam:   GENERAL ill-appearing HEAD, EYES, EARS, NOSE AND THROAT: Atraumatic, normocephalic. Extraocular muscles are intact. Pupils equal and reactive to light. Sclerae anicteric. No conjunctival injection. No oro-pharyngeal erythema.  NECK: Supple. There is no jugular venous distention. No bruits, no lymphadenopathy, no thyromegaly.  HEART: Regular rate and rhythm,. No murmurs, no rubs, no clicks.  LUNGS: Clear to auscultation bilaterally. No rales or rhonchi. No wheezes.  ABDOMEN: Soft, flat, nontender, nondistended. Has good bowel sounds. No hepatosplenomegaly appreciated.  EXTREMITIES: No evidence of any cyanosis, clubbing, or peripheral edema.  +2 pedal and radial pulses bilaterally.  NEUROLOGIC: The patient is alert, no focal deficits noted confused SKIN: Moist  and warm with no rashes appreciated.  Psych: Not anxious, depressed LN: No inguinal LN enlargement    Antibiotics   Anti-infectives (From admission, onward)   Start     Dose/Rate Route Frequency Ordered Stop   09/07/2018 2000  ceFAZolin (ANCEF) IVPB 2g/100 mL premix  Status:  Discontinued     2 g 200 mL/hr over 30 Minutes Intravenous Every 6 hours 08/20/2018 1616 2018/07/09 0846      Medications   Scheduled Meds:  Continuous Infusions: . morphine 5 mg/hr (2018/07/09 0904)   PRN Meds:.acetaminophen, ALPRAZolam, divalproex, glycopyrrolate, metoCLOPramide **OR** metoCLOPramide (  REGLAN) injection, morphine injection, ondansetron **OR** ondansetron (ZOFRAN) IV   Data Review:   Micro Results Recent Results (from the past 240 hour(s))  SARS Coronavirus 2 (CEPHEID - Performed in [facility] hospital lab), Hosp Order     Status: None   Collection Time: 2019-03-09  9:40 PM   Specimen: Nasopharyngeal Swab  Result Value Ref Range Status   SARS Coronavirus 2 NEGATIVE NEGATIVE Final    Comment: (NOTE) If result is NEGATIVE SARS-CoV-2 target nucleic acids are NOT DETECTED. The SARS-CoV-2 RNA is generally detectable in upper and lower  respiratory specimens during the acute phase of infection. The lowest  concentration of SARS-CoV-2 viral copies this assay can detect is 250  copies / mL. A negative result does not preclude SARS-CoV-2 infection  and should not be used as the sole basis for treatment or other  patient management decisions.  A negative result may occur with  improper specimen collection / handling, submission of specimen other  than nasopharyngeal swab, presence of viral mutation(s) within the  areas targeted by this assay, and inadequate number of viral copies  (<250 copies / mL). A negative result must be combined with clinical  observations, patient history, and epidemiological information. If result is POSITIVE SARS-CoV-2 target nucleic acids are DETECTED. The SARS-CoV-2 RNA  is generally detectable in upper and lower  respiratory specimens dur ing the acute phase of infection.  Positive  results are indicative of active infection with SARS-CoV-2.  Clinical  correlation with patient history and other diagnostic information is  necessary to determine patient infection status.  Positive results do  not rule out bacterial infection or co-infection with other viruses. If result is PRESUMPTIVE POSTIVE SARS-CoV-2 nucleic acids MAY BE PRESENT.   A presumptive positive result was obtained on the submitted specimen  and confirmed on repeat testing.  While 2019 novel coronavirus  (SARS-CoV-2) nucleic acids may be present in the submitted sample  additional confirmatory testing may be necessary for epidemiological  and / or clinical management purposes  to differentiate between  SARS-CoV-2 and other Sarbecovirus currently known to infect humans.  If clinically indicated additional testing with an alternate test  methodology [phone]([id number]) is advised. The SARS-CoV-2 RNA is generally  detectable in upper and lower respiratory sp ecimens during the acute  phase of infection. The expected result is Negative. Fact Sheet for Patients:  BoilerBrush.com.cyhttps://www.fda.gov/media/136312/download Fact Sheet for Healthcare Providers: https://pope.com/https://www.fda.gov/media/136313/download This test is not yet approved or cleared by the [place] FDA and has been authorized for detection and/or diagnosis of SARS-CoV-2 by FDA under an Emergency Use Authorization (EUA).  This EUA will remain in effect (meaning this test can be used) for the duration of the COVID-19 declaration under Section 564(b)(1) of the Act, 21 U.S.C. section 360bbb-3(b)(1), unless the authorization is terminated or revoked sooner. Performed at [facility], [address] [place], [place] [address]   MRSA PCR Screening     Status: Abnormal   Collection Time: 08/30/18 12:16 AM   Specimen: Nasal Mucosa; Nasopharyngeal  Result  Value Ref Range Status   MRSA by PCR POSITIVE (A) NEGATIVE Final    Comment:        The GeneXpert MRSA Assay (FDA approved for NASAL specimens only), is one component of a comprehensive MRSA colonization surveillance program. It is not intended to diagnose MRSA infection nor to guide or monitor treatment for MRSA infections. CRITICAL RESULT CALLED TO, READ BACK BY AND VERIFIED WITH: OLIVE KIBERENGE @150  08/30/2018 TTG  Performed at  [facility], [address] [place], [place] [address]     Radiology Reports Dg Chest 1 View  Result Date: 09/15/2018 CLINICAL DATA:  Pt arrives via ems from [facility]. Ems reports pt fell From unknown cause without hitting head or any LOC. Hx of dementia at baseline. Outward rotation of the right hip with shortening noted on arrival. EXAM: CHEST  1 VIEW COMPARISON:  07/21/2017 FINDINGS: Stable changes from prior CABG surgery. Cardiac silhouette is mildly enlarged. Right anterior chest wall biventricular cardioverter-defibrillator is stable. No mediastinal or hilar masses. Bilateral apically pleural thickening with associated a pickle parenchymal scarring, unchanged. Lungs otherwise clear. No pleural effusion and no pneumothorax. Skeletal structures are demineralized. No acute skeletal abnormality. IMPRESSION: No acute cardiopulmonary disease. Electronically Signed   By: Amie Portlandavid  Ormond M.D.   On: 09/09/2018 19:52   Dg Hip Operative Unilat W Or W/o Pelvis Right  Result Date: 01/11/19 CLINICAL DATA:  ORIF of a right proximal femur fracture. EXAM: OPERATIVE RIGHT HIP (WITH PELVIS IF PERFORMED) 6 VIEWS TECHNIQUE: Fluoroscopic spot image(s) were submitted for interpretation post-operatively. COMPARISON:  08/27/2018 FINDINGS: Images show placement of an intramedullary rod extending into the distal femur, supporting a compression screw that crosses the femoral neck into the femoral head. This reduces the primary fracture components into near  anatomic alignment, femoral neck component remains mildly inferiorly displaced. Orthopedic hardware appears well-seated. No evidence of an operative complication. IMPRESSION: Well aligned proximal right femur fracture following ORIF. Electronically Signed   By: Amie Portlandavid  Ormond M.D.   On: 010/25/20 16:38   Dg Hip Unilat W Or Wo Pelvis 2-3 Views Right  Result Date: 09/11/2018 CLINICAL DATA:  Fall with right hip pain and external rotation of the right leg. EXAM: DG HIP (WITH OR WITHOUT PELVIS) 2-3V RIGHT COMPARISON:  None. FINDINGS: There is a comminuted, displaced intertrochanteric fracture of the proximal right femur. The primary proximal fracture component is displaced from the distal fracture component by 1.2 cm superiorly and laterally. There is also mild varus angulation. There separate fractures of the greater and lesser trochanters. There are no other fractures.  No bone lesions. Hip joints, SI joints and symphysis pubis are normally aligned. Skeletal structures are diffusely demineralized. IMPRESSION: Comminuted, mildly displaced intertrochanteric fracture of the proximal right femur. No dislocation. Electronically Signed   By: Amie Portlandavid  Ormond M.D.   On: 08/22/2018 19:51     CBC Recent Labs  Lab 08/24/2018 1959 08/30/18 0509 09/13/2018 0451  WBC 10.0 12.8* 16.8*  HGB 13.3 13.0 10.4*  HCT 40.5 40.0 33.7*  PLT 114* 117* 129*  MCV 95.7 97.8 102.4*  MCH 31.4 31.8 31.6  MCHC 32.8 32.5 30.9  RDW 12.4 12.4 13.0  LYMPHSABS 1.9  --   --   MONOABS 0.8  --   --   EOSABS 0.1  --   --   BASOSABS 0.0  --   --     Chemistries  Recent Labs  Lab 08/18/2018 1959 08/30/18 0509 08/21/2018 0451  NA 140 141 145  K 3.8 4.0 5.3*  CL 105 105 107  CO2 24 24 25   GLUCOSE 160* 142* 171*  BUN 27* 25* 51*  CREATININE 1.33* 1.33* 2.94*  CALCIUM 8.6* 8.7* 8.5*  AST 18  --   --   ALT 13  --   --   ALKPHOS 133*  --   --   BILITOT 0.7  --   --     ------------------------------------------------------------------------------------------------------------------ estimated creatinine clearance is 15.8  mL/min (A) (by C-G formula based on SCr of 2.94 mg/dL (H)). ------------------------------------------------------------------------------------------------------------------ No results for input(s): HGBA1C in the last 72 hours. ------------------------------------------------------------------------------------------------------------------ No results for input(s): CHOL, HDL, LDLCALC, TRIG, CHOLHDL, LDLDIRECT in the last 72 hours. ------------------------------------------------------------------------------------------------------------------ No results for input(s): TSH, T4TOTAL, T3FREE, THYROIDAB in the last 72 hours.  Invalid input(s): FREET3 ------------------------------------------------------------------------------------------------------------------ No results for input(s): VITAMINB12, FOLATE, FERRITIN, TIBC, IRON, RETICCTPCT in the last 72 hours.  Coagulation profile Recent Labs  Lab 08/27/2018 1959  INR 1.2    No results for input(s): DDIMER in the last 72 hours.  Cardiac Enzymes No results for input(s): CKMB, TROPONINI, MYOGLOBIN in the last 168 hours.  Invalid input(s): CK ------------------------------------------------------------------------------------------------------------------ Invalid input(s): [place]  Patient is 85 year old status post fall  Acute respiratory failure postop discussions with the family regarding overall poor prognosis and possible comfort care   Closed right hip fracture ([place]) -plan per surgery per orthopedics today Cleared by cardiology Moderate to high risk based on advanced age and chronic medical conditions  Chronic systolic CHF (congestive heart failure) ([place]) -continue home meds     Atrial fibrillation ([place]) -continue home medications.    Continue to  monitor   CAD (coronary artery disease) -continue home meds   CKD (chronic kidney disease), stage III ([place]) -at baseline, avoid nephrotoxins and monitor       Code Status Orders  (From admission, onward)         Start     Ordered   09/14/2018 2348  Do not attempt resuscitation (DNR)  Continuous    Question Answer Comment  In the event of cardiac or respiratory ARREST Do not call a "code blue"   In the event of cardiac or respiratory ARREST Do not perform Intubation, CPR, defibrillation or ACLS   In the event of cardiac or respiratory ARREST Use medication by any route, position, wound care, and other measures to relive pain and suffering. May use oxygen, suction and manual treatment of airway obstruction as needed for comfort.      09/11/2018 2347        Code Status History    Date Active Date Inactive Code Status Order ID Comments User Context   03/15/2016 2340 03/20/2016 2027 Full Code [id number]  Lance Coon, MD Inpatient   12/10/2015 2005 12/13/2015 1104 Full Code [id number]  Henreitta Leber, MD Inpatient   06/03/2015 0438 06/03/2015 1503 Full Code [id number]  Fritzi Mandes, MD Inpatient   Advance Care Planning Activity    Advance Directive Documentation     Most Recent Value  Type of Advance Directive  Out of facility DNR (pink MOST or yellow form)  Pre-existing out of facility DNR order (yellow form or pink MOST form)  Yellow form placed in chart (order not valid for inpatient use), Physician notified to receive inpatient order  "MOST" Form in Place?  -           Consults cardiology and orthopedics   DVT Prophylaxis SCDs until surgery is over Lab Results  Component Value Date   PLT 129 (L) 09/14/2018     Time Spent in minutes 35 minutes greater than 50% of time spent in care coordination and counseling patient regarding the condition and plan of care.   Dustin Flock M.D on 08/20/2018 at 10:45 AM  Between 7am to 6pm - Pager - [phone]  After 6pm go to  [email or web address] - Proofreader  [employer]   Office  [phone]

## 2018-09-17 NOTE — Progress Notes (Signed)
Spoke with pt's daughter who would like to transition to full comfort measures at this time. Will speak with Dr. Lurlean Nanny regarding same.

## 2018-09-17 NOTE — Progress Notes (Signed)
   Subjective: 1 Day Post-Op Procedure(s) (LRB): INTRAMEDULLARY (IM) NAIL INTERTROCHANTRIC (Right) Patient in ICU. On comfort care. Non responsive this am.   Objective: Vital signs in last 24 hours: Temp:  [97.7 F (36.5 C)-97.8 F (36.6 C)] 97.8 F (36.6 C) (06/14 1618) Pulse Rate:  [61-99] 73 (06/15 0900) Resp:  [13-22] 15 (06/15 0900) BP: (79-174)/(44-105) 83/44 (06/15 0800) SpO2:  [49 %-100 %] 57 % (06/15 0900) FiO2 (%):  [6 %] 6 % (06/14 1430) Weight:  [92.1 kg] 92.1 kg (06/14 1618)  Intake/Output from previous day: 06/14 0701 - 06/15 0700 In: 500 [I.V.:500] Out: 100 [Urine:50; Blood:50] Intake/Output this shift: No intake/output data recorded.  Recent Labs    09/10/2018 1959 08/30/18 0509 08/23/2018 0451  HGB 13.3 13.0 10.4*   Recent Labs    08/30/18 0509 08/23/2018 0451  WBC 12.8* 16.8*  RBC 4.09* 3.29*  HCT 40.0 33.7*  PLT 117* 129*   Recent Labs    08/30/18 0509 09/10/2018 0451  NA 141 145  K 4.0 5.3*  CL 105 107  CO2 24 25  BUN 25* 51*  CREATININE 1.33* 2.94*  GLUCOSE 142* 171*  CALCIUM 8.7* 8.5*   Recent Labs    09/11/2018 1959  INR 1.2    EXAM General - Patient is non responsive. Extremity - right thigh soft, no warmth or redness. Calf soft Dressing - dressing C/D/I and no drainage   Past Medical History:  Diagnosis Date  . AICD (automatic cardioverter/defibrillator) present   . Atrial fibrillation ([place])   . CHF (congestive heart failure) ([place])   . CKD (chronic kidney disease)   . Coronary artery disease   . GERD (gastroesophageal reflux disease)   . Presence of permanent cardiac pacemaker     Assessment/Plan:   1 Day Post-Op Procedure(s) (LRB): INTRAMEDULLARY (IM) NAIL INTERTROCHANTRIC (Right) Principal Problem:   Closed right hip fracture (HCC) Active Problems:   Chronic systolic CHF (congestive heart failure) (HCC)   Atrial fibrillation (HCC)   CAD (coronary artery disease)   CKD (chronic kidney disease), stage III (HCC)  Hip fracture, intertrochanteric (HCC)  Estimated body mass index is 27.54 kg/m as calculated from the following:   Height as of this encounter: 6' (1.829 m).   Weight as of this encounter: 92.1 kg.  Medical decline after surgery. Patient currently on comfort care. Operative sight appears well. Weight-Bearing as tolerated to right leg   T. Rachelle Hora, PA-C [place] 09/11/2018, 9:11 AM

## 2018-09-17 NOTE — Progress Notes (Signed)
Advanced care plan.  Purpose of the Encounter: Comfort care measures  Parties in Attendance: Patient's daughter  Patient's Decision Capacity: Not intact  Subjective/Patient's story:  Patient is 85 year old with history of chronic systolic CHF, atrial fibrillation, coronary artery disease, chronic kidney disease stage III who was admitted after a fall and noted to have hip fracture.  Patient's daughter was consented for him to have a hip fracture repair.  Post repair patient developed acute respiratory failure and required stent in the ICU.  Objective/Medical story  Due to advanced age discussions were held with the patient's family regarding overall poor prognosis.  Goals of care determination:   Patient will be made comfort care morphine drip will be started  CODE STATUS: Comfort care   Time spent discussing advanced care planning: 16 minutes

## 2018-09-17 NOTE — Progress Notes (Signed)
   2018/09/25 1100  Clinical Encounter Type  Visited With Patient and family together  Visit Type Follow-up  Referral From Nurse  Spiritual Encounters  Spiritual Needs Emotional;Grief support  Stress Factors  Family Stress Factors Loss  Pt passed fifteen minutes prior to the arrival of the ch. Daughter and two grandchildren and granddaughter in law were present. Family processed grief by sharing fond memories of the pt. They recollected pt's healthy days which lasted until very recent. Family described that the pt had a blessed full life. Daughter lost a close friend last Wednesday and is dealing with the grief. Family is in support with one another. Family had no need for a chaplain at this moment and appreciated the kindness of the hospital staff. Family has had a very positive experience with the hospital staff and expressed gratitude.

## 2018-09-17 NOTE — Progress Notes (Signed)
Called into the pt's room by his grandson who was verbalizing "I think he is gone"; pt DNR/NMP, pt with no pulse nor respirations at this time, pt pronounced by R. Nancee Liter and M. Ermalinda Barrios RN; family at the bedside, emotional support offered to me; Chaplin paged for emotional support.

## 2018-09-17 NOTE — Progress Notes (Signed)
PT Cancellation Note  Patient Details Name: Vincent [name] MRN: [id number] DOB: 1919-07-28   Cancelled Treatment:    Reason Eval/Treat Not Completed: Medical issues which prohibited therapy(Per chart review, patient with significant medical decline s/p procedure; now transitioning to comfort care.  Orders completed.  Please reconsult should needs/goals of care change.)   Aylen Stradford H. Owens Shark, PT, DPT, NCS 09/05/2018, 8:48 AM [phone]

## 2018-09-17 DEATH — deceased

## 2020-07-13 IMAGING — CR CHEST  1 VIEW
1 series · 1 of 1 positions shown · non-contrast
Comparison: 07/21/2017

CLINICAL DATA: Pt arrives via ems from [facility] [REDACTED].
Ems reports pt fell From unknown cause without hitting head or any
LOC. Hx of dementia at baseline. Outward rotation of the right hip
with shortening noted on arrival.

EXAM:
CHEST  1 VIEW

[chest ap]
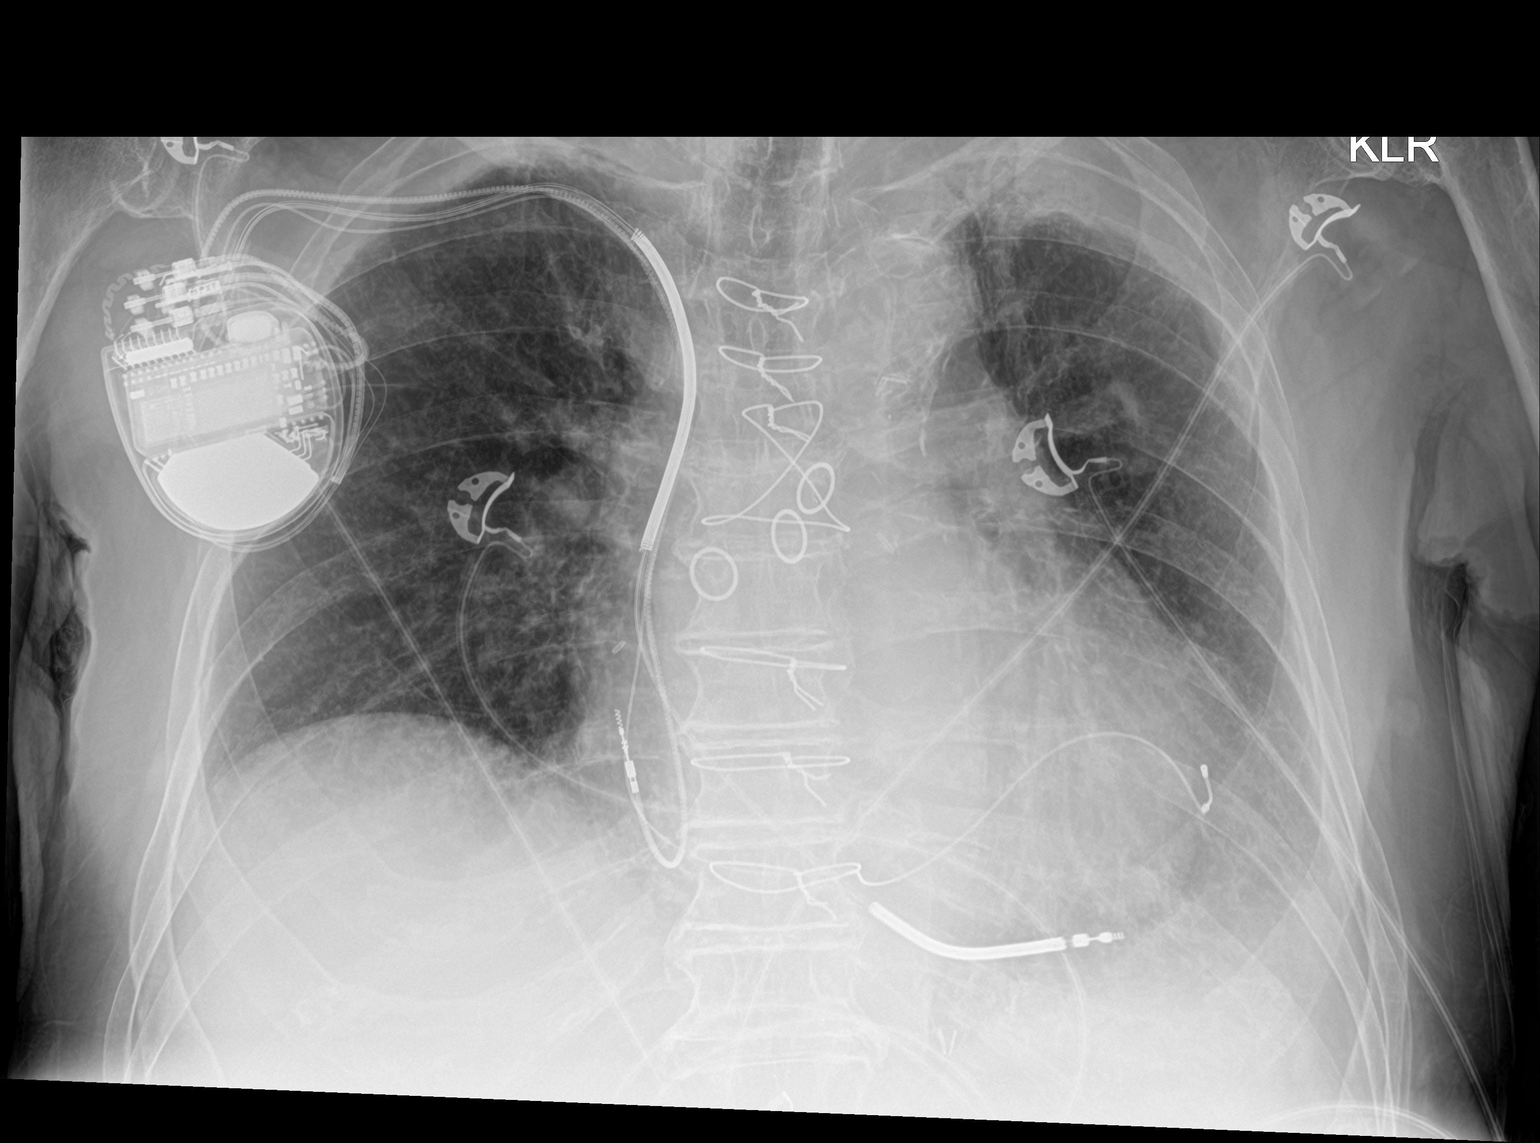

[1 of 1 positions shown; findings below may reference images not displayed]

FINDINGS: Stable changes from prior CABG surgery. Cardiac silhouette is mildly
enlarged. Right anterior chest wall biventricular
cardioverter-defibrillator is stable. No mediastinal or hilar
masses.

Bilateral apically pleural thickening with associated a pickle
parenchymal scarring, unchanged. Lungs otherwise clear. No pleural
effusion and no pneumothorax.

Skeletal structures are demineralized. No acute skeletal
abnormality.
IMPRESSION: No acute cardiopulmonary disease.

## 2020-07-13 IMAGING — CR DG HIP (WITH OR WITHOUT PELVIS) 2-3V RIGHT
3 series · 3 of 3 positions shown · non-contrast
Comparison: None.

CLINICAL DATA: Fall with right hip pain and external rotation of
the right leg.

EXAM:
DG HIP (WITH OR WITHOUT PELVIS) 2-3V RIGHT

[pelvis ap]
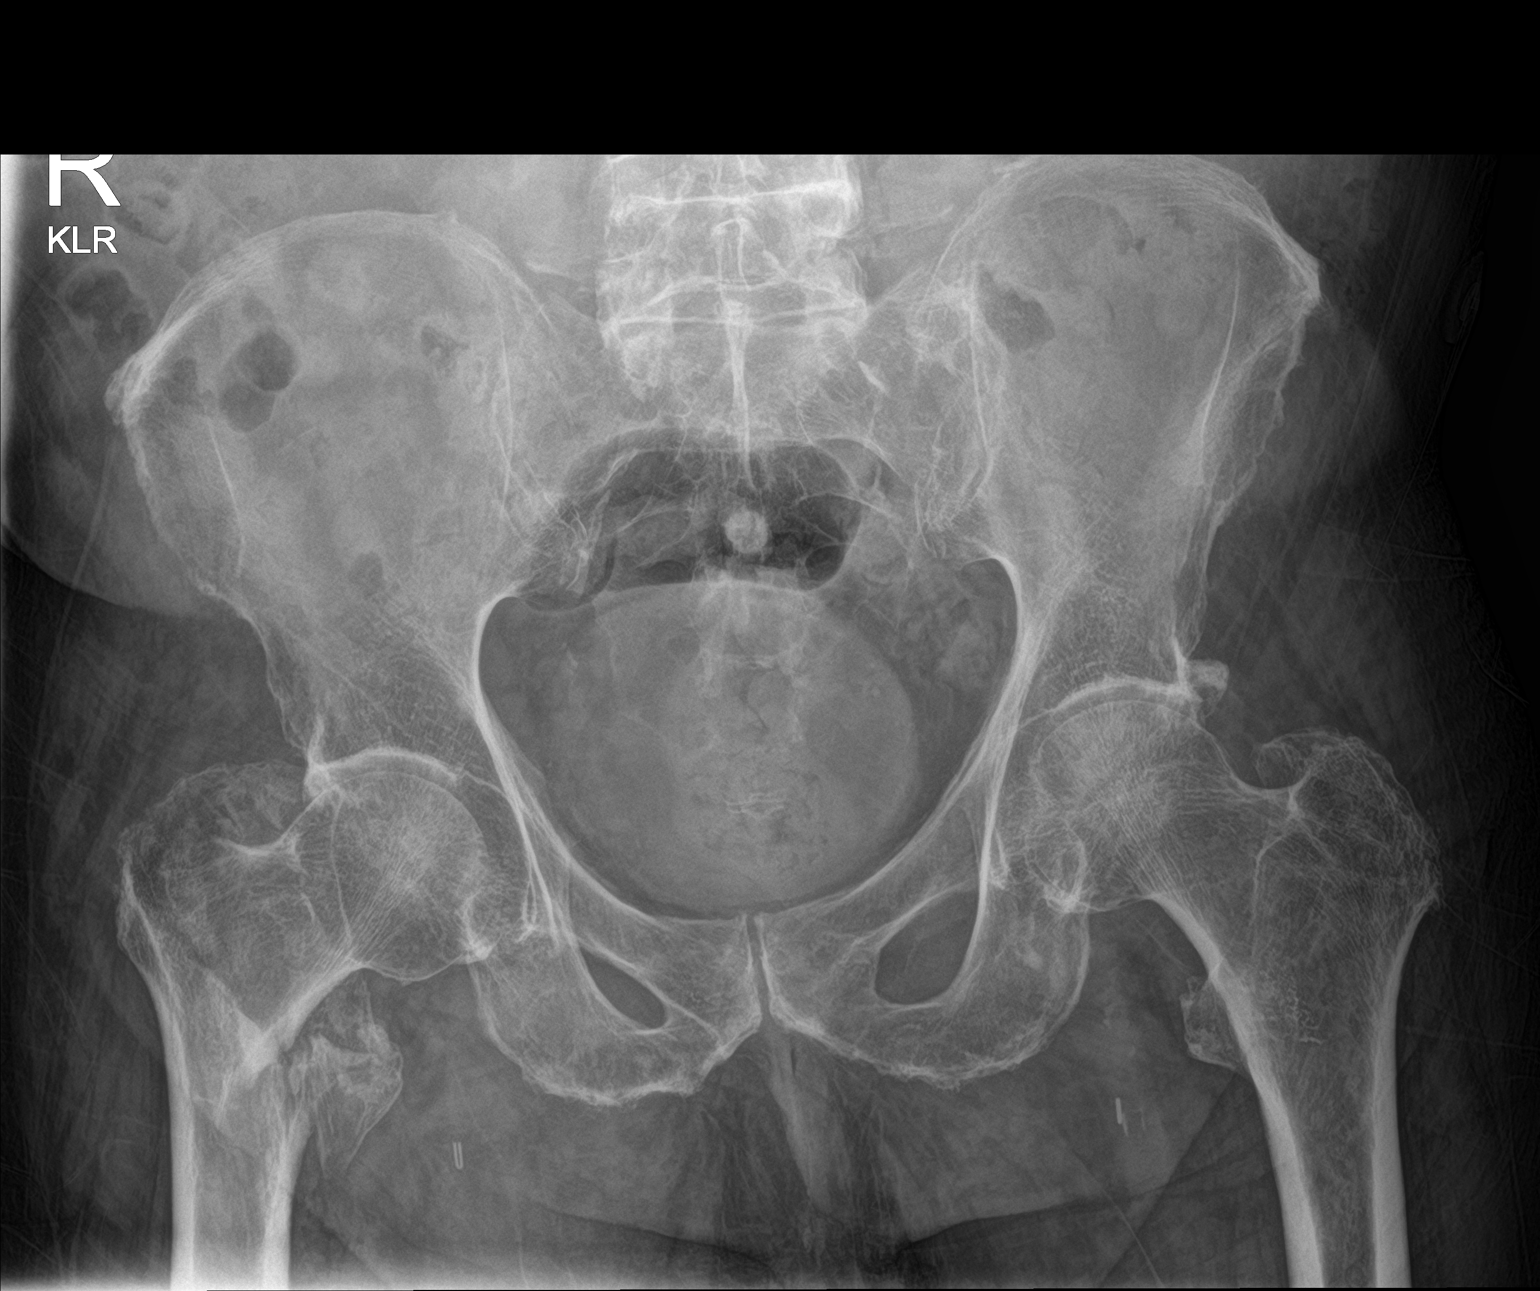

[hip ap]
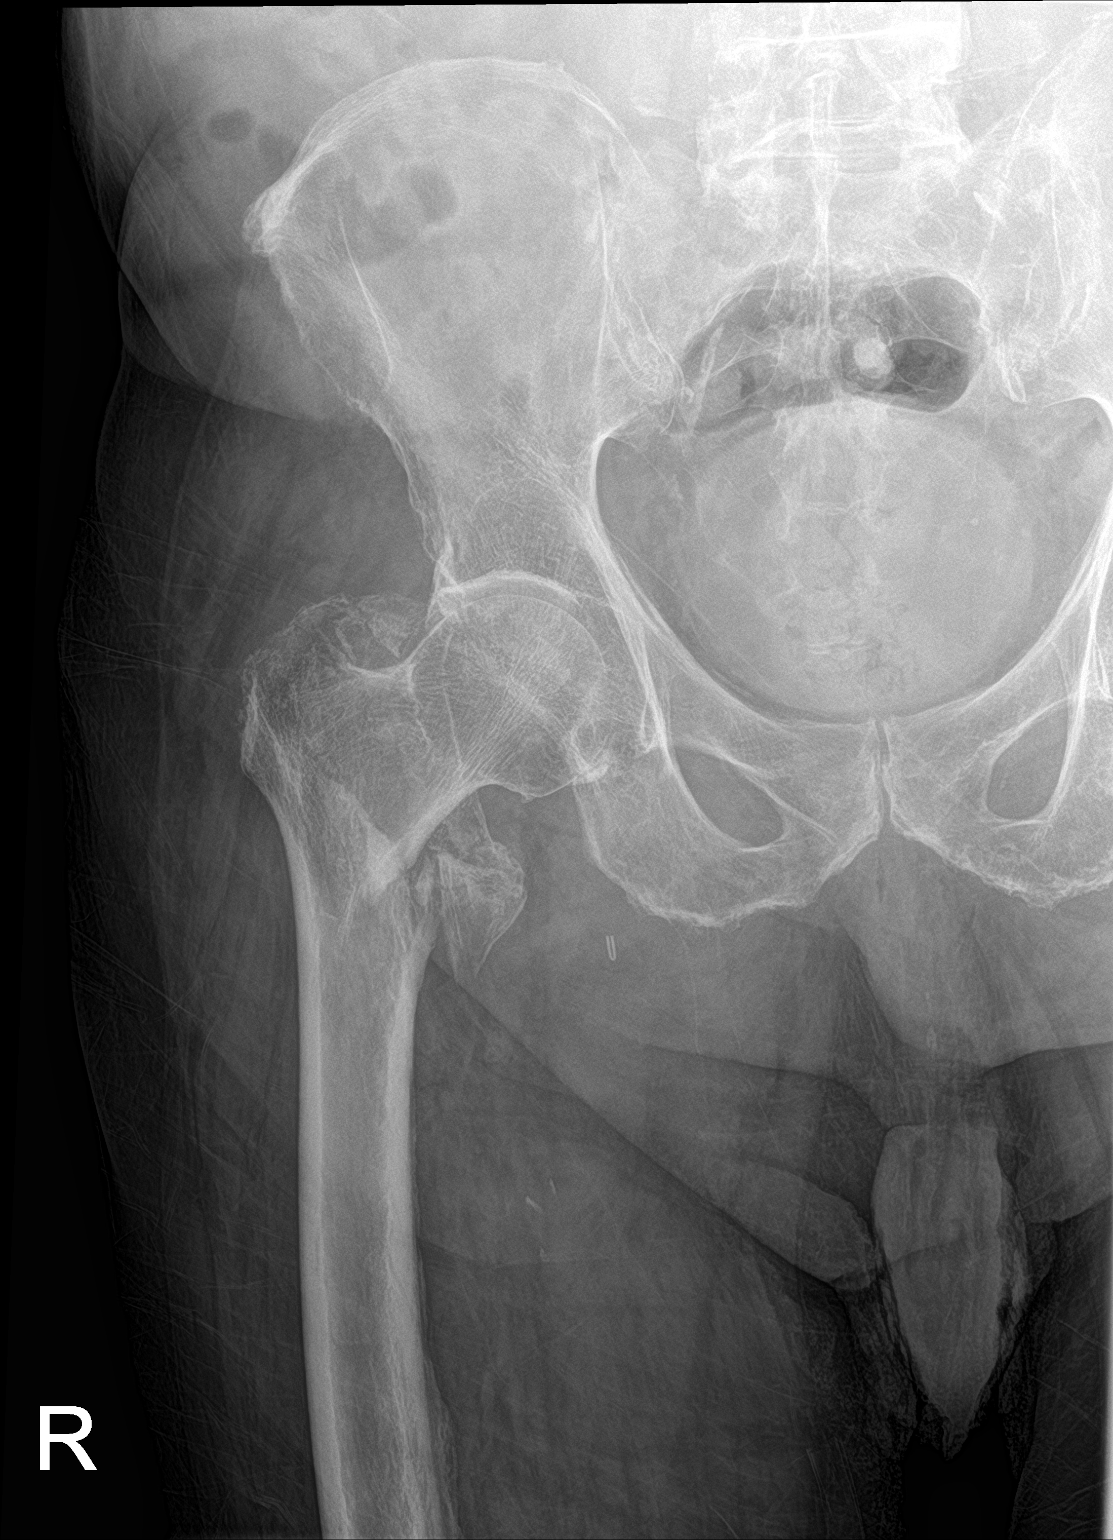

[hip lat]
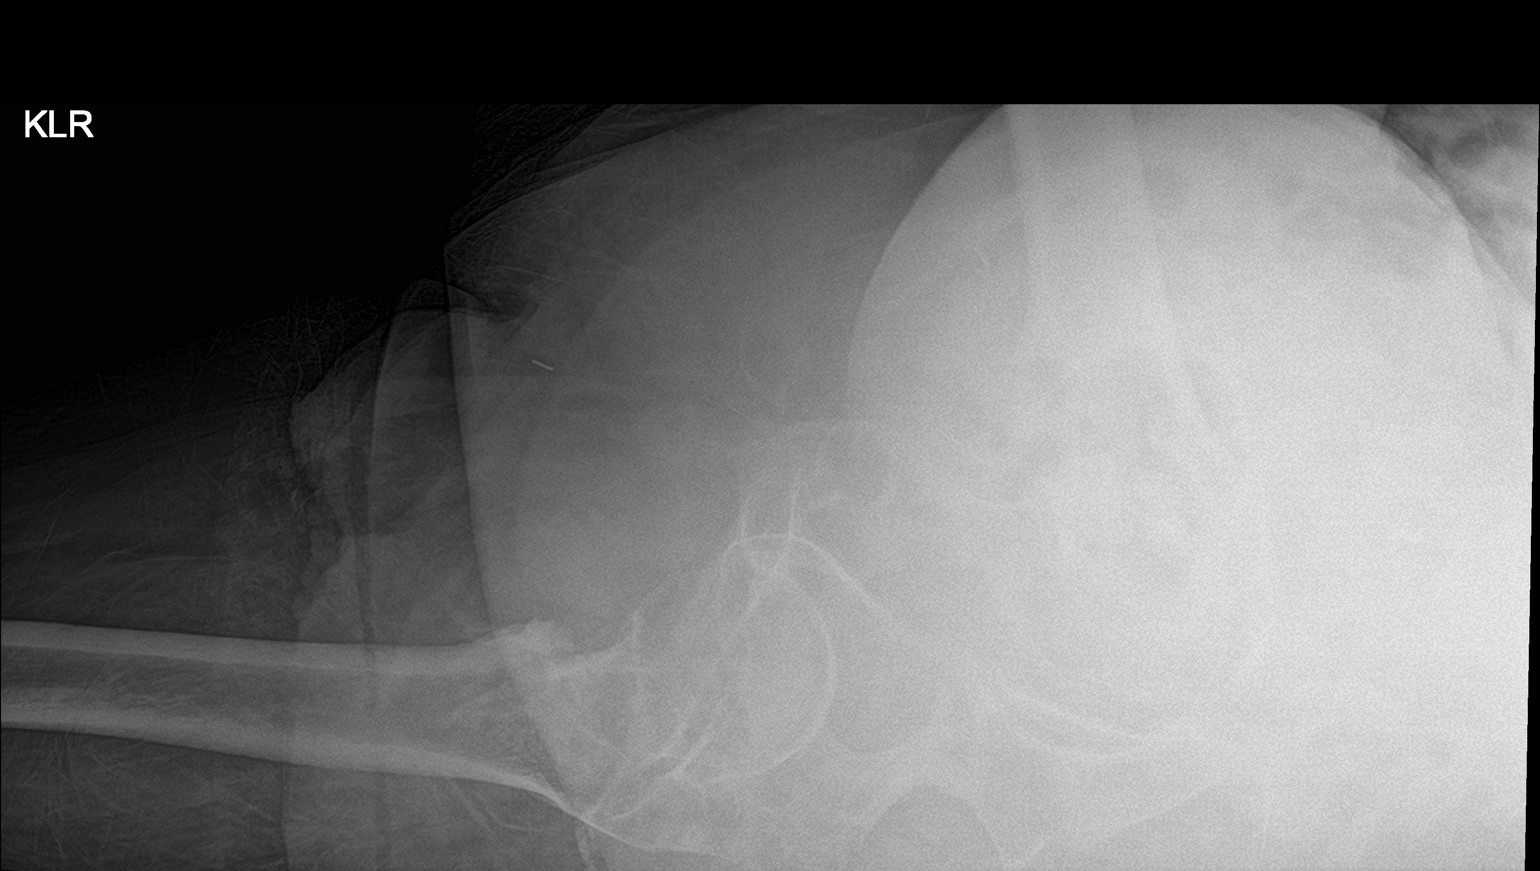

[3 of 3 positions shown; findings below may reference images not displayed]

FINDINGS: There is a comminuted, displaced intertrochanteric fracture of the
proximal right femur. The primary proximal fracture component is
displaced from the distal fracture component by 1.2 cm superiorly
and laterally. There is also mild varus angulation. There separate
fractures of the greater and lesser trochanters.

There are no other fractures.  No bone lesions.

Hip joints, SI joints and symphysis pubis are normally aligned.

Skeletal structures are diffusely demineralized.
IMPRESSION: Comminuted, mildly displaced intertrochanteric fracture of the
proximal right femur. No dislocation.
